# Patient Record
Sex: Male | Born: 1975
Health system: Southern US, Community
[De-identification: ages and names within clinical notes are randomized; demographics above are authoritative.]

## PROBLEM LIST (undated history)

## (undated) DIAGNOSIS — Z0000 Encounter for general adult medical examination without abnormal findings: Secondary | ICD-10-CM

## (undated) HISTORY — DX: Encounter for general adult medical examination without abnormal findings: Z00.00

---

## 2007-09-15 ENCOUNTER — Ambulatory Visit: Payer: Self-pay | Admitting: Emergency Medicine

## 2014-12-23 ENCOUNTER — Telehealth: Payer: Self-pay | Admitting: *Deleted

## 2014-12-24 NOTE — Telephone Encounter (Signed)
Yes I will take Jeremy Carpenter since Dr Gilda CreaseSchnier referred hi m

## 2014-12-24 NOTE — Telephone Encounter (Signed)
Thanks just as soon as you can .

## 2014-12-24 NOTE — Telephone Encounter (Signed)
Called to schedule pt, no answer, no voice mail enabled.  Will try back asap.

## 2014-12-24 NOTE — Telephone Encounter (Signed)
pt got a referral from Dr. Gilda CreaseSchnier to come and see Dr Darrick Huntsmanullo as a new pt. Offered new pt appt with Lyla Sonarrie. pt wife wanted him to see a MD Can you see if Dr. Darrick Huntsmanullo will see this pt as a new pt.  Please advise.

## 2014-12-24 NOTE — Telephone Encounter (Signed)
Please call and schedule patient for new patient appointment.

## 2015-01-15 ENCOUNTER — Ambulatory Visit (INDEPENDENT_AMBULATORY_CARE_PROVIDER_SITE_OTHER): Payer: 59 | Admitting: Internal Medicine

## 2015-01-15 ENCOUNTER — Encounter: Payer: Self-pay | Admitting: Internal Medicine

## 2015-01-15 VITALS — BP 118/78 | HR 64 | Temp 97.6°F | Resp 16 | Ht 70.0 in | Wt 222.5 lb

## 2015-01-15 DIAGNOSIS — Z23 Encounter for immunization: Secondary | ICD-10-CM

## 2015-01-15 DIAGNOSIS — Z0000 Encounter for general adult medical examination without abnormal findings: Secondary | ICD-10-CM | POA: Diagnosis not present

## 2015-01-15 DIAGNOSIS — Z0001 Encounter for general adult medical examination with abnormal findings: Secondary | ICD-10-CM | POA: Insufficient documentation

## 2015-01-15 DIAGNOSIS — E669 Obesity, unspecified: Secondary | ICD-10-CM

## 2015-01-15 DIAGNOSIS — R5383 Other fatigue: Secondary | ICD-10-CM

## 2015-01-15 DIAGNOSIS — Z13220 Encounter for screening for lipoid disorders: Secondary | ICD-10-CM | POA: Diagnosis not present

## 2015-01-15 DIAGNOSIS — Z1159 Encounter for screening for other viral diseases: Secondary | ICD-10-CM | POA: Diagnosis not present

## 2015-01-15 HISTORY — DX: Encounter for general adult medical examination without abnormal findings: Z00.00

## 2015-01-15 LAB — CBC WITH DIFFERENTIAL/PLATELET
BASOS ABS: 0.1 10*3/uL (ref 0.0–0.1)
BASOS PCT: 1 % (ref 0–1)
Eosinophils Absolute: 0.1 10*3/uL (ref 0.0–0.7)
Eosinophils Relative: 1 % (ref 0–5)
HCT: 45.1 % (ref 39.0–52.0)
Hemoglobin: 15.3 g/dL (ref 13.0–17.0)
Lymphocytes Relative: 27 % (ref 12–46)
Lymphs Abs: 1.6 10*3/uL (ref 0.7–4.0)
MCH: 29 pg (ref 26.0–34.0)
MCHC: 33.9 g/dL (ref 30.0–36.0)
MCV: 85.6 fL (ref 78.0–100.0)
MONO ABS: 0.7 10*3/uL (ref 0.1–1.0)
MPV: 10.3 fL (ref 8.6–12.4)
Monocytes Relative: 11 % (ref 3–12)
Neutro Abs: 3.6 10*3/uL (ref 1.7–7.7)
Neutrophils Relative %: 60 % (ref 43–77)
PLATELETS: 242 10*3/uL (ref 150–400)
RBC: 5.27 MIL/uL (ref 4.22–5.81)
RDW: 13 % (ref 11.5–15.5)
WBC: 6 10*3/uL (ref 4.0–10.5)

## 2015-01-15 LAB — COMPREHENSIVE METABOLIC PANEL
ALK PHOS: 58 U/L (ref 39–117)
ALT: 20 U/L (ref 0–53)
AST: 21 U/L (ref 0–37)
Albumin: 4.5 g/dL (ref 3.5–5.2)
BILIRUBIN TOTAL: 0.5 mg/dL (ref 0.2–1.2)
BUN: 16 mg/dL (ref 6–23)
CHLORIDE: 103 meq/L (ref 96–112)
CO2: 27 mEq/L (ref 19–32)
CREATININE: 1.41 mg/dL — AB (ref 0.50–1.35)
Calcium: 9.7 mg/dL (ref 8.4–10.5)
GLUCOSE: 74 mg/dL (ref 70–99)
POTASSIUM: 4.5 meq/L (ref 3.5–5.3)
Sodium: 140 mEq/L (ref 135–145)
Total Protein: 7.3 g/dL (ref 6.0–8.3)

## 2015-01-15 LAB — LIPID PANEL
CHOLESTEROL: 206 mg/dL — AB (ref 0–200)
HDL: 37 mg/dL — ABNORMAL LOW (ref >=40)
LDL CALC: 129 mg/dL — AB (ref 0–99)
TRIGLYCERIDES: 201 mg/dL — AB (ref <150)
Total CHOL/HDL Ratio: 5.6 Ratio
VLDL: 40 mg/dL (ref 0–40)

## 2015-01-15 NOTE — Progress Notes (Addendum)
Patient ID: Jeremy Carpenter, male   DOB: 11/29/1975, 39 y.o.   MRN: [id number]  The patient is here for his annual male physical examination and to establish care as a new .  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  Workup included an ENT evaluation and included CT /MRI of  Brain  vs sinuses , which was done at [facility]   PMH:  No hospitlaizations .  Had chicken pox as a child.  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    history of anosmia   unaccompaned by sinsu infeciton, , head injury 13 years ago ENT eval included CT /MRI of  Brain  vs sinuses  ,done at [facility]   PMH:  No hospitalizations .  Had chicken pox as a child and has had contact dermatitisi to [place]   Works i for [employer].  Making stamps, standing all day long,  Cement floor ,  Feet hurt .  ofr the past year,  Prior to that worked  For [employer],  Had one dog bite not reported,   s no complaints today.   The risk factors are reflected in the social history.  The roster of all physicians providing medical care to patient - is listed in the Snapshot section of the chart.  Activities of daily living:  The patient is 100% independent in all ADLs: dressing, toileting, feeding as well as independent mobility  Home safety : The patient has smoke detectors in the home. He wears seatbelts.  There are no firearms at home. There is no violence in the home.   There is no risks for hepatitis, STDs or HIV. There is no   history of blood transfusion. There is no travel history to infectious disease endemic areas of the world.  The patient has seen their  dentist in the last six month and  their eye doctor in the last year.  They do not  have excessive sun exposure. They have seen a dermatoloigist in the last year. Discussed the need for sun protection: hats, long sleeves and use of sunscreen if there is significant sun exposure.   Diet: the importance of a healthy diet is discussed. They do have a healthy diet.  The benefits of regular aerobic exercise were discussed. He exercises a minimum of 30 minutes  5 days per week. Depression screen: there are no signs or vegative symptoms of depression- irritability, change in appetite, anhedonia, sadness/tearfullness.  The following portions of the patient's history were reviewed and updated as appropriate: allergies, current medications, past family history, past medical history,  past surgical history, past social history  and problem list.  Visual acuity was not assessed per patient preference since he has regular follow up with his ophthalmologist. Hearing and body mass index were assessed and reviewed.   During the course of the visit the patient was educated and counseled about appropriate screening and preventive services including :  nutrition counseling, colorectal cancer screening, and recommended  immunizations.     Review of Systems:  Patient denies headache, fevers, malaise, unintentional weight loss, skin rash, eye pain, sinus congestion and sinus pain, sore throat, dysphagia,  hemoptysis , cough, dyspnea, wheezing, chest pain, palpitations, orthopnea, edema, abdominal pain, nausea, melena, diarrhea, constipation, flank pain, dysuria, hematuria, urinary  Frequency, nocturia, numbness, tingling, seizures,  Focal weakness, Loss of consciousness,  Tremor, insomnia, depression, anxiety, and suicidal ideation.     Objective:  BP 118/78 mmHg  Pulse 64  Temp(Src) 97.6 F (36.4 C) (Oral)  Resp 16  Ht $R'5\' 10"'gM$  (1.778 m)  Wt 222 lb 8 oz (100.925 kg)  BMI 31.93 kg/m2  SpO2 97%   General  appearance: alert, cooperative and appears stated age Ears: normal TM's and external ear canals both ears Throat: lips, mucosa, and tongue normal; teeth and gums normal Neck: no adenopathy, no carotid bruit, supple, symmetrical, trachea midline and thyroid not enlarged, symmetric, no tenderness/mass/nodules Back: symmetric, no curvature. ROM normal. No CVA tenderness. Lungs: clear to auscultation bilaterally Heart: regular rate and rhythm, S1, S2 normal, no murmur, click, rub or gallop Abdomen: soft, non-tender; bowel sounds normal; no masses,  no organomegaly Pulses: 2+ and symmetric Skin: Skin color, texture, turgor normal. No rashes or lesions Lymph nodes: Cervical, supraclavicular, and axillary nodes normal.  Assessment and Plan:   Problem List Items Addressed This Visit    Visit for preventive health examination    Annual wellness  exam was done as well as a comprehensive physical exam and management of acute and chronic conditions .  During the course of the visit the patient was educated and counseled about appropriate screening and preventive services including :  diabetes screening, lipid analysis with projected  10 year  risk for CAD , nutrition counseling, colorectal cancer screening, and recommended immunizations.  Printed recommendations for health maintenance screenings was given.       Relevant Orders   CBC w/Diff (Completed)   Comp Met (CMET) (Completed)   TSH (Completed)   Lipid panel (Completed)    Other Visit Diagnoses    Need for hepatitis C screening test    -  Primary    Relevant Orders    Hepatitis C antibody (Completed)    CBC w/Diff (Completed)    Comp Met (CMET) (Completed)    TSH (Completed)    Lipid panel (Completed)    Screening for hyperlipidemia        Relevant Orders    CBC w/Diff (Completed)    Comp Met (CMET) (Completed)    TSH (Completed)    Lipid panel (Completed)    Obesity        Relevant Orders    CBC w/Diff (Completed)    Comp Met  (CMET) (Completed)    TSH (Completed)    Lipid panel (Completed)    Other fatigue        Relevant Orders    CBC w/Diff (Completed)    Comp Met (CMET) (Completed)    TSH (Completed)    Lipid panel (Completed)    Need for prophylactic vaccination with combined diphtheria-tetanus-pertussis (DTP) vaccine        Relevant Orders    Tdap vaccine greater than or equal to 7yo IM (Completed)

## 2015-01-15 NOTE — Progress Notes (Signed)
Pre-visit discussion using our clinic review tool. No additional management support is needed unless otherwise documented below in the visit note.  

## 2015-01-15 NOTE — Patient Instructions (Signed)

## 2015-01-16 LAB — TSH: TSH: 2.065 u[IU]/mL (ref 0.350–4.500)

## 2015-01-16 LAB — HEPATITIS C ANTIBODY: HCV Ab: NEGATIVE

## 2015-01-17 ENCOUNTER — Telehealth: Payer: Self-pay | Admitting: Internal Medicine

## 2015-01-17 DIAGNOSIS — N289 Disorder of kidney and ureter, unspecified: Secondary | ICD-10-CM

## 2015-01-17 NOTE — Assessment & Plan Note (Signed)

## 2015-01-17 NOTE — Telephone Encounter (Signed)
Your thyroid and liver function are normal.  Your kidney function is slightly abnormal and your cholesterol is borderline.  I suggest repeating the kidney function blood test in a week, and the cholesterol in 6 months   Please call the office to set up the lab appointments.     Regards,   Dr. Darrick Huntsmanullo

## 2016-09-06 DIAGNOSIS — H5212 Myopia, left eye: Secondary | ICD-10-CM | POA: Diagnosis not present

## 2016-09-06 DIAGNOSIS — H52221 Regular astigmatism, right eye: Secondary | ICD-10-CM | POA: Diagnosis not present

## 2018-02-01 ENCOUNTER — Ambulatory Visit (INDEPENDENT_AMBULATORY_CARE_PROVIDER_SITE_OTHER): Payer: No Typology Code available for payment source | Admitting: Internal Medicine

## 2018-02-01 ENCOUNTER — Encounter: Payer: Self-pay | Admitting: Internal Medicine

## 2018-02-01 VITALS — BP 122/88 | HR 71 | Temp 98.6°F | Resp 15 | Ht 70.0 in | Wt 238.4 lb

## 2018-02-01 DIAGNOSIS — R030 Elevated blood-pressure reading, without diagnosis of hypertension: Secondary | ICD-10-CM | POA: Diagnosis not present

## 2018-02-01 DIAGNOSIS — E669 Obesity, unspecified: Secondary | ICD-10-CM

## 2018-02-01 DIAGNOSIS — Z0000 Encounter for general adult medical examination without abnormal findings: Secondary | ICD-10-CM | POA: Diagnosis not present

## 2018-02-01 DIAGNOSIS — Z13220 Encounter for screening for lipoid disorders: Secondary | ICD-10-CM | POA: Diagnosis not present

## 2018-02-01 DIAGNOSIS — R5383 Other fatigue: Secondary | ICD-10-CM | POA: Diagnosis not present

## 2018-02-01 NOTE — Progress Notes (Signed)
Patient ID: Jeremy Carpenter, male    DOB: [birth year]  Age: 42 y.o. MRN: [id number]  The patient is here for a a preventive  examination and management of other chronic and acute problems. Last seen Jan 15 2015 . Did not return for repeat assessment f abnormal renal fumction    The risk factors are reflected in the social history.  The roster of all physicians providing medical care to patient - is listed in the Snapshot section of the chart.  Activities of daily living:  The patient is 100% independent in all ADLs: dressing, toileting, feeding as well as independent mobility  Home safety : The patient has smoke detectors in the home. They wear seatbelts.  There are no firearms at home. There is no violence in the home.   There is no risks for hepatitis, STDs or HIV. There is no   history of blood transfusion. They have no travel history to infectious disease endemic areas of the world.  The patient has seen their dentist in the last six month. They havenot  seen their eye doctor in the last year.    They do not  have excessive sun exposure. Discussed the need for sun protection: hats, long sleeves and use of sunscreen if there is significant sun exposure.   Diet: the importance of a healthy diet is discussed. They do have a healthy diet.  The benefits of regular aerobic exercise were discussed. He does not exercise regularly    Depression screen: there are no signs or vegative symptoms of depression- irritability, change in appetite, anhedonia, sadness/tearfullness.   The following portions of the patient's history were reviewed and updated as appropriate: allergies, current medications, past family history, past medical history,  past surgical history, past social history  and problem list.  Visual acuity was not assessed per patient preference since he plans to have regular follow up with her ophthalmologist. Hearing and body mass index were assessed and reviewed.   During the course of the  visit the patient was educated and counseled about appropriate screening and preventive services including : , diabetes screening, obesity counseling, colorectal cancer screening, and recommended immunizations.    CC: The primary encounter diagnosis was Elevated blood-pressure reading without diagnosis of hypertension. Diagnoses of Screening for hyperlipidemia, Fatigue, unspecified type, Elevated blood pressure reading in office without diagnosis of hypertension, Obesity (BMI 30.0-34.9), and Visit for preventive health examination were also pertinent to this visit.  1) feeling sluggish all the time.  Sleeps poorly,  Snores.  Falling asleep before dinner   Sleep latency at night is 1  To 2 hours, denies anxiety.  Uses TV to fall asleep . Not exercising   Obesity :  Goal is 215 for BMI of 30.  Last time he saw 215 was 4 years ago ,  Same job,  More physical activity .    3) bilateral knee pain aggravated by squatting  .  Right elbow hurting for the past 6 months lateral side  repetitive lifting of 20 lb bin all day long . Plays softball in a community league ,  Pain is aggravated by throwing .  Has not tried icing it,  Has used Aleve and motrin .   History Jeremy Carpenter has a past medical history of Visit for preventive health examination (01/15/2015).   He has no past surgical history on file.   His family history includes Cancer in his maternal grandmother.He reports that he quit smoking about 18 years ago. His  smoking use included cigarettes. He started smoking about 22 years ago. He smoked 1.00 pack per day. He has never used smokeless tobacco. He reports that he drinks alcohol. He reports that he does not use drugs.  Outpatient Medications Prior to Visit  Medication Sig Dispense Refill  . Multiple Vitamin (MULTIVITAMIN) tablet Take 1 tablet by mouth daily.     No facility-administered medications prior to visit.     Review of Systems   .Patient denies headache, fevers, malaise, unintentional weight  loss, skin rash, eye pain, sinus congestion and sinus pain, sore throat, dysphagia,  hemoptysis , cough, dyspnea, wheezing, chest pain, palpitations, orthopnea, edema, abdominal pain, nausea, melena, diarrhea, constipation, flank pain, dysuria, hematuria, urinary  Frequency, nocturia, numbness, tingling, seizures,  Focal weakness, Loss of consciousness,  Tremor, insomnia, depression, anxiety, and suicidal ideation.      Objective:  BP 122/88 (BP Location: Left Arm, Patient Position: Sitting, Cuff Size: Normal)   Pulse 71   Temp 98.6 F (37 C) (Oral)   Resp 15   Ht  (1.778 m)   Wt 238 lb 6.4 oz (108.1 kg)   SpO2 96%   BMI 34.21 kg/m   Physical Exam   General appearance: alert, cooperative and appears stated age Ears: normal TM's and external ear canals both ears Throat: lips, mucosa, and tongue normal; teeth and gums normal Neck: no adenopathy, no carotid bruit, supple, symmetrical, trachea midline and thyroid not enlarged, symmetric, no tenderness/mass/nodules Back: symmetric, no curvature. ROM normal. No CVA tenderness. Lungs: clear to auscultation bilaterally Heart: regular rate and rhythm, S1, S2 normal, no murmur, click, rub or gallop Abdomen: soft, non-tender; bowel sounds normal; no masses,  no organomegaly Pulses: 2+ and symmetric Skin: Skin color, texture, turgor normal. No rashes or lesions Lymph nodes: Cervical, supraclavicular, and axillary nodes normal.    Assessment & Plan:   Problem List Items Addressed This Visit    Obesity (BMI 30.0-34.9)    I have addressed  BMI and recommended wt loss of 10% of body weigh over the next 6 months using a low glycemic index diet and regular exercise a minimum of 5 days per week.        Encounter for well adult exam with abnormal findings    Annual comprehensive preventive exam was done as well as an evaluation and management of chronic conditions .  During the course of the visit the patient was educated and counseled  about appropriate screening and preventive services including :  diabetes screening, lipid analysis with projected  10 year  risk for CAD , nutrition counseling, and colorectal cancer screening, and recommended immunizations.  Printed recommendations for health maintenance screenings was given.  Elevated blood pressure, obesity and mild hyperlipidemia were noted.   Lab Results  Component Value Date   CHOL 217 (H) 02/01/2018   HDL 45 02/01/2018   LDLCALC 141 (H) 02/01/2018   TRIG 174 (H) 02/01/2018   CHOLHDL 4.8 02/01/2018   Lab Results  Component Value Date   TSH 1.55 02/01/2018   No results found for: HGBA1C       Elevated blood pressure reading in office without diagnosis of hypertension    He has no prior history of hypertension. He will check his blood pressure several times over the next 3-4 weeks and to submit readings for evaluation. Urine microalbumin to creatinine ratio done today is normal.  Lab Results  Component Value Date   MICROALBUR 0.5 02/01/2018   Lab Results  Component Value  Date   CREATININE 1.16 02/01/2018   Lab Results  Component Value Date   NA 138 02/01/2018   K 4.3 02/01/2018   CL 102 02/01/2018   CO2 30 02/01/2018          Other Visit Diagnoses    Elevated blood-pressure reading without diagnosis of hypertension    -  Primary   Relevant Orders   Comprehensive metabolic panel (Completed)   Microalbumin / creatinine urine ratio (Completed)   Screening for hyperlipidemia       Relevant Orders   Lipid panel (Completed)   Fatigue, unspecified type       Relevant Orders   TSH (Completed)   CBC with Differential/Platelet (Completed)      I am having Jeremy Carpenter maintain his multivitamin.  No orders of the defined types were placed in this encounter.   There are no discontinued medications.  Follow-up: Return in about 1 year (around 02/02/2019).   Sherlene Shams, MD

## 2018-02-01 NOTE — Patient Instructions (Signed)
The new goals for optimal blood pressure management start at  120/70.  130/80 is now considered HIGH.  Please check your blood pressure a few times at home and send me the readings so I can determine if you need to start medication    I recommend starting a daily exercise program to help you lose weight  Your goal is 215 lbs to get your BMI < 30   Health Maintenance, Male A healthy lifestyle and preventive care is important for your health and wellness. Ask your health care provider about what schedule of regular examinations is right for you. What should I know about weight and diet? Eat a Healthy Diet  Eat plenty of vegetables, fruits, whole grains, low-fat dairy products, and lean protein.  Do not eat a lot of foods high in solid fats, added sugars, or salt.  Maintain a Healthy Weight Regular exercise can help you achieve or maintain a healthy weight. You should:  Do at least 150 minutes of exercise each week. The exercise should increase your heart rate and make you sweat (moderate-intensity exercise).  Do strength-training exercises at least twice a week.  Watch Your Levels of Cholesterol and Blood Lipids  Have your blood tested for lipids and cholesterol every 5 years starting at 42 years of age. If you are at high risk for heart disease, you should start having your blood tested when you are 42 years old. You may need to have your cholesterol levels checked more often if: ? Your lipid or cholesterol levels are high. ? You are older than 42 years of age. ? You are at high risk for heart disease.  What should I know about cancer screening? Many types of cancers can be detected early and may often be prevented. Lung Cancer  You should be screened every year for lung cancer if: ? You are a current smoker who has smoked for at least 30 years. ? You are a former smoker who has quit within the past 15 years.  Talk to your health care provider about your screening options, when  you should start screening, and how often you should be screened.  Colorectal Cancer  Routine colorectal cancer screening usually begins at 42 years of age and should be repeated every 5-10 years until you are 42 years old. You may need to be screened more often if early forms of precancerous polyps or small growths are found. Your health care provider may recommend screening at an earlier age if you have risk factors for colon cancer.  Your health care provider may recommend using home test kits to check for hidden blood in the stool.  A small camera at the end of a tube can be used to examine your colon (sigmoidoscopy or colonoscopy). This checks for the earliest forms of colorectal cancer.  Prostate and Testicular Cancer  Depending on your age and overall health, your health care provider may do certain tests to screen for prostate and testicular cancer.  Talk to your health care provider about any symptoms or concerns you have about testicular or prostate cancer.  Skin Cancer  Check your skin from head to toe regularly.  Tell your health care provider about any new moles or changes in moles, especially if: ? There is a change in a mole's size, shape, or color. ? You have a mole that is larger than a pencil eraser.  Always use sunscreen. Apply sunscreen liberally and repeat throughout the day.  Protect yourself by wearing long  sleeves, pants, a wide-brimmed hat, and sunglasses when outside.  What should I know about heart disease, diabetes, and high blood pressure?  If you are 55-31 years of age, have your blood pressure checked every 3-5 years. If you are 59 years of age or older, have your blood pressure checked every year. You should have your blood pressure measured twice-once when you are at a hospital or clinic, and once when you are not at a hospital or clinic. Record the average of the two measurements. To check your blood pressure when you are not at a hospital or clinic,  you can use: ? An automated blood pressure machine at a pharmacy. ? A home blood pressure monitor.  Talk to your health care provider about your target blood pressure.  If you are between 35-32 years old, ask your health care provider if you should take aspirin to prevent heart disease.  Have regular diabetes screenings by checking your fasting blood sugar level. ? If you are at a normal weight and have a low risk for diabetes, have this test once every three years after the age of 1. ? If you are overweight and have a high risk for diabetes, consider being tested at a younger age or more often.  A one-time screening for abdominal aortic aneurysm (AAA) by ultrasound is recommended for men aged 65-75 years who are current or former smokers. What should I know about preventing infection? Hepatitis B If you have a higher risk for hepatitis B, you should be screened for this virus. Talk with your health care provider to find out if you are at risk for hepatitis B infection. Hepatitis C Blood testing is recommended for:  Everyone born from [birth year] through 1965.  Anyone with known risk factors for hepatitis C.  Sexually Transmitted Diseases (STDs)  You should be screened each year for STDs including gonorrhea and chlamydia if: ? You are sexually active and are younger than 42 years of age. ? You are older than 42 years of age and your health care provider tells you that you are at risk for this type of infection. ? Your sexual activity has changed since you were last screened and you are at an increased risk for chlamydia or gonorrhea. Ask your health care provider if you are at risk.  Talk with your health care provider about whether you are at high risk of being infected with HIV. Your health care provider may recommend a prescription medicine to help prevent HIV infection.  What else can I do?  Schedule regular health, dental, and eye exams.  Stay current with your vaccines  (immunizations).  Do not use any tobacco products, such as cigarettes, chewing tobacco, and e-cigarettes. If you need help quitting, ask your health care provider.  Limit alcohol intake to no more than 2 drinks per day. One drink equals 12 ounces of beer, 5 ounces of wine, or 1 ounces of hard liquor.  Do not use street drugs.  Do not share needles.  Ask your health care provider for help if you need support or information about quitting drugs.  Tell your health care provider if you often feel depressed.  Tell your health care provider if you have ever been abused or do not feel safe at home. This information is not intended to replace advice given to you by your health care provider. Make sure you discuss any questions you have with your health care provider. Document Released: 02/24/2008 Document Revised: 04/26/2016 Document Reviewed: 06/01/2015  Elsevier Interactive Patient Education  2018 [employer]  

## 2018-02-02 LAB — MICROALBUMIN / CREATININE URINE RATIO
Creatinine, Urine: 251 mg/dL (ref 20–320)
MICROALB UR: 0.5 mg/dL
MICROALB/CREAT RATIO: 2 ug/mg{creat} (ref <30)

## 2018-02-02 LAB — COMPREHENSIVE METABOLIC PANEL WITH GFR
AG Ratio: 1.8 (calc) (ref 1.0–2.5)
ALT: 31 U/L (ref 9–46)
AST: 24 U/L (ref 10–40)
Albumin: 4.5 g/dL (ref 3.6–5.1)
Alkaline phosphatase (APISO): 87 U/L (ref 40–115)
BUN: 14 mg/dL (ref 7–25)
CO2: 30 mmol/L (ref 20–32)
Calcium: 9.8 mg/dL (ref 8.6–10.3)
Chloride: 102 mmol/L (ref 98–110)
Creat: 1.16 mg/dL (ref 0.60–1.35)
Globulin: 2.5 g/dL (ref 1.9–3.7)
Glucose, Bld: 93 mg/dL (ref 65–99)
Potassium: 4.3 mmol/L (ref 3.5–5.3)
Sodium: 138 mmol/L (ref 135–146)
Total Bilirubin: 0.6 mg/dL (ref 0.2–1.2)
Total Protein: 7 g/dL (ref 6.1–8.1)

## 2018-02-02 LAB — TSH: TSH: 1.55 m[IU]/L (ref 0.40–4.50)

## 2018-02-02 LAB — LIPID PANEL
Cholesterol: 217 mg/dL — ABNORMAL HIGH
HDL: 45 mg/dL
LDL Cholesterol (Calc): 141 mg/dL — ABNORMAL HIGH
Non-HDL Cholesterol (Calc): 172 mg/dL — ABNORMAL HIGH
Total CHOL/HDL Ratio: 4.8 (calc)
Triglycerides: 174 mg/dL — ABNORMAL HIGH

## 2018-02-02 LAB — CBC WITH DIFFERENTIAL/PLATELET
Basophils Absolute: 40 cells/uL (ref 0–200)
Basophils Relative: 0.6 %
EOS PCT: 0.6 %
Eosinophils Absolute: 40 cells/uL (ref 15–500)
HEMATOCRIT: 42.6 % (ref 38.5–50.0)
HEMOGLOBIN: 14.5 g/dL (ref 13.2–17.1)
LYMPHS ABS: 2037 {cells}/uL (ref 850–3900)
MCH: 28.8 pg (ref 27.0–33.0)
MCHC: 34 g/dL (ref 32.0–36.0)
MCV: 84.7 fL (ref 80.0–100.0)
MPV: 10.5 fL (ref 7.5–12.5)
Monocytes Relative: 11.7 %
NEUTROS ABS: 3799 {cells}/uL (ref 1500–7800)
NEUTROS PCT: 56.7 %
Platelets: 256 10*3/uL (ref 140–400)
RBC: 5.03 10*6/uL (ref 4.20–5.80)
RDW: 12 % (ref 11.0–15.0)
Total Lymphocyte: 30.4 %
WBC: 6.7 10*3/uL (ref 3.8–10.8)
WBCMIX: 784 {cells}/uL (ref 200–950)

## 2018-02-02 LAB — EXTRA URINE SPECIMEN

## 2018-02-03 ENCOUNTER — Encounter: Payer: Self-pay | Admitting: Internal Medicine

## 2018-02-03 DIAGNOSIS — E669 Obesity, unspecified: Secondary | ICD-10-CM | POA: Insufficient documentation

## 2018-02-03 DIAGNOSIS — R030 Elevated blood-pressure reading, without diagnosis of hypertension: Secondary | ICD-10-CM | POA: Insufficient documentation

## 2018-02-03 NOTE — Assessment & Plan Note (Addendum)
He has no prior history of hypertension. He will check his blood pressure several times over the next 3-4 weeks and to submit readings for evaluation. Urine microalbumin to creatinine ratio done today is normal.  Lab Results  Component Value Date   MICROALBUR 0.5 02/01/2018   Lab Results  Component Value Date   CREATININE 1.16 02/01/2018   Lab Results  Component Value Date   NA 138 02/01/2018   K 4.3 02/01/2018   CL 102 02/01/2018   CO2 30 02/01/2018

## 2018-02-03 NOTE — Assessment & Plan Note (Addendum)
Annual comprehensive preventive exam was done as well as an evaluation and management of ELEVATED BLOOD PRESSURE AND OBESITY  .  During the course of the visit the patient was educated and counseled about appropriate screening and preventive services including :  diabetes screening, lipid analysis with projected  10 year  risk for CAD (5%)  , nutrition counseling, and colorectal cancer screening, and recommended immunizations.  Printed recommendations for health maintenance screenings was given.    Lab Results  Component Value Date   CHOL 217 (H) 02/01/2018   HDL 45 02/01/2018   LDLCALC 141 (H) 02/01/2018   TRIG 174 (H) 02/01/2018   CHOLHDL 4.8 02/01/2018   Lab Results  Component Value Date   TSH 1.55 02/01/2018   No results found for: HGBA1C

## 2018-02-03 NOTE — Assessment & Plan Note (Signed)
I have addressed  BMI and recommended wt loss of 10% of body weigh over the next 6 months using a low glycemic index diet and regular exercise a minimum of 5 days per week.   

## 2018-07-01 ENCOUNTER — Ambulatory Visit (INDEPENDENT_AMBULATORY_CARE_PROVIDER_SITE_OTHER): Payer: Self-pay | Admitting: Physician Assistant

## 2018-07-01 DIAGNOSIS — Z23 Encounter for immunization: Secondary | ICD-10-CM

## 2018-07-01 NOTE — Progress Notes (Signed)
Pt presents here today for visit to receive Influenza vaccine. Allergies reviewed, vaccine given IM left deltoid, vaccine information statement provided, tolerated well.  Lola Lofaro, RMA Registered Medical Assistant [facility] [phone] 

## 2018-07-01 NOTE — Patient Instructions (Signed)

## 2019-06-10 ENCOUNTER — Ambulatory Visit (INDEPENDENT_AMBULATORY_CARE_PROVIDER_SITE_OTHER): Payer: No Typology Code available for payment source | Admitting: Internal Medicine

## 2019-06-10 ENCOUNTER — Other Ambulatory Visit: Payer: Self-pay

## 2019-06-10 ENCOUNTER — Encounter: Payer: Self-pay | Admitting: Internal Medicine

## 2019-06-10 VITALS — BP 114/78 | HR 84 | Temp 97.7°F | Resp 14 | Ht 70.0 in | Wt 241.0 lb

## 2019-06-10 DIAGNOSIS — L247 Irritant contact dermatitis due to plants, except food: Secondary | ICD-10-CM | POA: Diagnosis not present

## 2019-06-10 DIAGNOSIS — Z0000 Encounter for general adult medical examination without abnormal findings: Secondary | ICD-10-CM

## 2019-06-10 DIAGNOSIS — Z23 Encounter for immunization: Secondary | ICD-10-CM

## 2019-06-10 DIAGNOSIS — E669 Obesity, unspecified: Secondary | ICD-10-CM

## 2019-06-10 MED ORDER — TRIAMCINOLONE ACETONIDE 0.1 % EX CREA: 1.0000 "application " | TOPICAL_CREAM | Freq: Two times a day (BID) | CUTANEOUS | 0 refills | Status: DC

## 2019-06-10 NOTE — Patient Instructions (Signed)

## 2019-06-10 NOTE — Assessment & Plan Note (Signed)
I have addressed  BMI and recommended wt loss of 10% of body weigh over the next 6 months using a low glycemic index diet and regular exercise a minimum of 5 days per week.   

## 2019-06-10 NOTE — Assessment & Plan Note (Signed)

## 2019-06-10 NOTE — Progress Notes (Signed)
Patient ID: Jeremy Carpenter, male    DOB: 1976/05/02  Age: 43 y.o. MRN: [id number]  The patient is here for annual preventive  examination and management of other chronic and acute problems.   The risk factors are reflected in the social history.  The roster of all physicians providing medical care to patient - is listed in the Snapshot section of the chart.  Activities of daily living:  The patient is 100% independent in all ADLs: dressing, toileting, feeding as well as independent mobility  Home safety : The patient has smoke detectors in the home. They wear seatbelts.  There are no firearms at home. There is no violence in the home.   There is no risks for hepatitis, STDs or HIV. There is no   history of blood transfusion. They have no travel history to infectious disease endemic areas of the world.  The patient has seen their dentist in the last six month. They have seen their eye doctor in the last year.  They do not  have excessive sun exposure. Discussed the need for sun protection: hats, long sleeves and use of sunscreen if there is significant sun exposure.   Diet: the importance of a healthy diet is discussed. They do have a healthy diet.  The benefits of regular aerobic exercise were discussed. He does not exercise regularly   Depression screen: there are no signs or vegative symptoms of depression- irritability, change in appetite, anhedonia, sadness/tearfullness.  Cognitive assessment: the patient manages all their financial and personal affairs and is actively engaged. They could relate day,date,year and events; recalled 2/3 objects at 3 minutes; performed clock-face test normally.  The following portions of the patient's history were reviewed and updated as appropriate: allergies, current medications, past family history, past medical history,  past surgical history, past social history  and problem list.  Visual acuity was not assessed per patient preference since she has regular  follow up with her ophthalmologist. Hearing and body mass index were assessed and reviewed.   During the course of the visit the patient was educated and counseled about appropriate screening and preventive services including : fall prevention , diabetes screening, nutrition counseling, colorectal cancer screening, and recommended immunizations.    CC: Diagnoses of Need for immunization against influenza, Obesity (BMI 30.0-34.9), Encounter for preventive health examination, and Contact dermatitis and eczema due to plant were pertinent to this visit.  The patient has no signs or symptoms of COVID 19 infection (fever, cough, sore throat  or shortness of breath beyond what is typical for patient).  Patient denies contact with other persons with the above mentioned symptoms or with anyone confirmed to have [place] on hand.  First vesicles appeared on Saturday   Occasional orthostasis after crouching  For extended periods of time.    History Jeremy Carpenter has a past medical history of Visit for preventive health examination (01/15/2015).   He has no past surgical history on file.   His family history includes Cancer in his maternal grandmother.He reports that he quit smoking about 19 years ago. His smoking use included cigarettes. He started smoking about 23 years ago. He smoked 1.00 pack per day. He has never used smokeless tobacco. He reports current alcohol use. He reports that he does not use drugs.  Outpatient Medications Prior to Visit  Medication Sig Dispense Refill  . Multiple Vitamin (MULTIVITAMIN) tablet Take 1 tablet by mouth daily.     No facility-administered medications prior to visit.  Review of Systems   Patient denies headache, fevers, malaise, unintentional weight loss, skin rash, eye pain, sinus congestion and sinus pain, sore throat, dysphagia,  hemoptysis , cough, dyspnea, wheezing, chest pain, palpitations, orthopnea, edema, abdominal pain, nausea, melena,  diarrhea, constipation, flank pain, dysuria, hematuria, urinary  Frequency, nocturia, numbness, tingling, seizures,  Focal weakness, Loss of consciousness,  Tremor, insomnia, depression, anxiety, and suicidal ideation.     Objective:  BP 114/78 (BP Location: Left Arm, Patient Position: Sitting, Cuff Size: Large)   Pulse 84   Temp 97.7 F (36.5 C) (Temporal)   Resp 14   Ht 5\' 10"  (1.778 m)   Wt 241 lb (109.3 kg)   SpO2 96%   BMI 34.58 kg/m   Physical Exam  .General appearance: alert, cooperative and appears stated age Ears: normal TM's and external ear canals both ears Throat: lips, mucosa, and tongue normal; teeth and gums normal Neck: no adenopathy, no carotid bruit, supple, symmetrical, trachea midline and thyroid not enlarged, symmetric, no tenderness/mass/nodules Back: symmetric, no curvature. ROM normal. No CVA tenderness. Lungs: clear to auscultation bilaterally Heart: regular rate and rhythm, S1, S2 normal, no murmur, click, rub or gallop Abdomen: soft, non-tender; bowel sounds normal; no masses,  no organomegaly Pulses: 2+ and symmetric Skin: vesicular rash on dorsal surface of right hand and left flank. Lymph nodes: Cervical, supraclavicular, and axillary nodes normal.   Assessment & Plan:   Problem List Items Addressed This Visit      Unprioritized   Encounter for preventive health examination    age appropriate education and counseling updated, referrals for preventative services and immunizations addressed, dietary and smoking counseling addressed, most recent labs reviewed.  I have personally reviewed and have noted:  1) the patient's medical and social history 2) The pt's use of alcohol, tobacco, and illicit drugs 3) The patient's current medications and supplements 4) Functional ability including ADL's, fall risk, home safety risk, hearing and visual impairment 5) Diet and physical activities 6) Evidence for depression or mood disorder 7) The patient's  height, weight, and BMI have been recorded in the chart  I have made referrals, and provided counseling and education based on review of the above      Obesity (BMI 30.0-34.9)    I have addressed  BMI and recommended wt loss of 10% of body weigh over the next 6 months using a low glycemic index diet and regular exercise a minimum of 5 days per week.        Contact dermatitis and eczema due to plant    Triamcinolone ointment prescribed for contact dermatitis secondary to poison sumac.       Other Visit Diagnoses    Need for immunization against influenza       Relevant Orders   Flu Vaccine QUAD 36+ mos IM (Completed)      I have discontinued Easter A. Korber's multivitamin. I am also having him start on triamcinolone cream.  Meds ordered this encounter  Medications  . triamcinolone cream (KENALOG) 0.1 %    Sig: Apply 1 application topically 2 (two) times daily.    Dispense:  30 g    Refill:  0    Medications Discontinued During This Encounter  Medication Reason  . Multiple Vitamin (MULTIVITAMIN) tablet Patient has not taken in last 30 days    Follow-up: No follow-ups on file.   01-13-1993, MD

## 2019-06-10 NOTE — Assessment & Plan Note (Signed)
Triamcinolone ointment prescribed for contact dermatitis secondary to poison sumac.

## 2019-09-09 ENCOUNTER — Ambulatory Visit: Payer: No Typology Code available for payment source | Attending: Internal Medicine

## 2019-09-09 DIAGNOSIS — Z20822 Contact with and (suspected) exposure to covid-19: Secondary | ICD-10-CM

## 2019-09-10 LAB — NOVEL CORONAVIRUS, NAA: SARS-CoV-2, NAA: NOT DETECTED

## 2019-10-09 ENCOUNTER — Ambulatory Visit: Payer: No Typology Code available for payment source | Attending: Internal Medicine

## 2019-10-09 DIAGNOSIS — Z20822 Contact with and (suspected) exposure to covid-19: Secondary | ICD-10-CM

## 2019-10-10 LAB — NOVEL CORONAVIRUS, NAA: SARS-CoV-2, NAA: NOT DETECTED

## 2020-02-03 ENCOUNTER — Ambulatory Visit: Payer: No Typology Code available for payment source | Attending: Internal Medicine

## 2020-02-03 DIAGNOSIS — Z23 Encounter for immunization: Secondary | ICD-10-CM

## 2020-02-03 NOTE — Progress Notes (Signed)
   Covid-19 Vaccination Clinic  Name:  Jeremy Carpenter    MRN: [id number] DOB: 04-Jun-1976  02/03/2020  [name] was observed post Covid-19 immunization for 15 minutes without incident. He was provided with Vaccine Information Sheet and instruction to access the V-Safe system.   [name] was instructed to call 911 with any severe reactions post vaccine: Marland Kitchen Difficulty breathing  . Swelling of face and throat  . A fast heartbeat  . A bad rash all over body  . Dizziness and weakness   Immunizations Administered    Name Date Dose VIS Date Route   Pfizer COVID-19 Vaccine 02/03/2020  3:19 PM 0.3 mL 11/05/2018 Intramuscular   Manufacturer: [employer], [employer]   Lot: [id number]   NDC: [address]-1

## 2020-02-24 ENCOUNTER — Ambulatory Visit: Payer: No Typology Code available for payment source | Attending: Internal Medicine

## 2020-02-24 DIAGNOSIS — Z23 Encounter for immunization: Secondary | ICD-10-CM

## 2020-02-24 NOTE — Progress Notes (Signed)
   Covid-19 Vaccination Clinic  Name:  PONCIANO SHEALY    MRN: [id number] DOB: 29-Nov-1975  02/24/2020  Mr. Schipani was observed post Covid-19 immunization for 15 minutes without incident. He was provided with Vaccine Information Sheet and instruction to access the V-Safe system.   Mr. Peets was instructed to call 911 with any severe reactions post vaccine: Marland Kitchen Difficulty breathing  . Swelling of face and throat  . A fast heartbeat  . A bad rash all over body  . Dizziness and weakness   Immunizations Administered    Name Date Dose VIS Date Route   Pfizer COVID-19 Vaccine 02/24/2020  3:59 PM 0.3 mL 11/05/2018 Intramuscular   Manufacturer: [employer], [employer]   Lot: [id number]   NDC: [address]-3

## 2020-11-09 ENCOUNTER — Other Ambulatory Visit (HOSPITAL_COMMUNITY): Payer: Self-pay | Admitting: Physician Assistant

## 2020-11-09 ENCOUNTER — Telehealth: Payer: No Typology Code available for payment source | Admitting: Physician Assistant

## 2020-11-09 DIAGNOSIS — J069 Acute upper respiratory infection, unspecified: Secondary | ICD-10-CM

## 2020-11-09 MED ORDER — FLUTICASONE PROPIONATE 50 MCG/ACT NA SUSP
2.0000 | Freq: Every day | NASAL | 0 refills | Status: DC
Start: 2020-11-09 — End: 2021-02-23

## 2020-11-09 MED ORDER — BENZONATATE 100 MG PO CAPS: 100.0000 mg | ORAL_CAPSULE | Freq: Three times a day (TID) | ORAL | 0 refills | Status: DC

## 2020-11-09 NOTE — Progress Notes (Signed)
We are sorry you are not feeling well.  Here is how we plan to help!  Based on what you have shared with me, it looks like you may have a viral upper respiratory infection.    Upper respiratory infections are caused by a large number of viruses and In light of the recent COVID pandemic, it is possible your symptoms could also be related to the COVID-19 virus and  I recommend you be tested for this.  Symptoms vary from person to person, with common symptoms including sore throat, cough, fatigue or lack of energy and feeling of general discomfort.  A low-grade fever of up to 100.4 may present, but is often uncommon.  Symptoms vary however, and are closely related to a person's age or underlying illnesses.  The most common symptoms associated with an upper respiratory infection are nasal discharge or congestion, cough, sneezing, headache and pressure in the ears and face.  These symptoms usually persist for about 3 to 10 days, but can last up to 2 weeks.  It is important to know that upper respiratory infections do not cause serious illness or complications in most cases.    Upper respiratory infections can be transmitted from person to person, with the most common method of transmission being a person's hands.  The virus is able to live on the skin and can infect other persons for up to 2 hours after direct contact.  Also, these can be transmitted when someone coughs or sneezes; thus, it is important to cover the mouth to reduce this risk.  To keep the spread of the illness at bay, good hand hygiene is very important.  This is an infection that is most likely caused by a virus. There are no specific treatments other than to help you with the symptoms until the infection runs its course.  We are sorry you are not feeling well.  Here is how we plan to help!   For nasal congestion, you may use an oral decongestants such as Mucinex D or if you have glaucoma or high blood pressure use plain Mucinex.  Saline  nasal spray or nasal drops can help and can safely be used as often as needed for congestion.  For your congestion, I have prescribed Fluticasone nasal spray one spray in each nostril twice a day  If you do not have a history of heart disease, hypertension, diabetes or thyroid disease, prostate/bladder issues or glaucoma, you may also use Sudafed to treat nasal congestion.  It is highly recommended that you consult with a pharmacist or your primary care physician to ensure this medication is safe for you to take.     If you have a cough, you may use cough suppressants such as Delsym and Robitussin.  If you have glaucoma or high blood pressure, you can also use Coricidin HBP.   For cough I have prescribed for you A prescription cough medication called Tessalon Perles 100 mg. You may take 1-2 capsules every 8 hours as needed for cough  If you have a sore or scratchy throat, use a saltwater gargle-  to  teaspoon of salt dissolved in a 4-ounce to 8-ounce glass of warm water.  Gargle the solution for approximately 15-30 seconds and then spit.  It is important not to swallow the solution.  You can also use throat lozenges/cough drops and Chloraseptic spray to help with throat pain or discomfort.  Warm or cold liquids can also be helpful in relieving throat pain.  For headache,  pain or general discomfort, you can use Ibuprofen or Tylenol as directed.   Some authorities believe that zinc sprays or the use of Echinacea may shorten the course of your symptoms.   HOME CARE . Only take medications as instructed by your medical team. . Be sure to drink plenty of fluids. Water is fine as well as fruit juices, sodas and electrolyte beverages. You may want to stay away from caffeine or alcohol. If you are nauseated, try taking small sips of liquids. How do you know if you are getting enough fluid? Your urine should be a pale yellow or almost colorless. . Get rest. . Taking a steamy shower or using a humidifier  may help nasal congestion and ease sore throat pain. You can place a towel over your head and breathe in the steam from hot water coming from a faucet. . Using a saline nasal spray works much the same way. . Cough drops, hard candies and sore throat lozenges may ease your cough. . Avoid close contacts especially the very young and the elderly . Cover your mouth if you cough or sneeze . Always remember to wash your hands.   GET HELP RIGHT AWAY IF: . You develop worsening fever. . If your symptoms do not improve within 10 days . You develop yellow or green discharge from your nose over 3 days. . You have coughing fits . You develop a severe head ache or visual changes. . You develop shortness of breath, difficulty breathing or start having chest pain . Your symptoms persist after you have completed your treatment plan  MAKE SURE YOU   Understand these instructions.  Will watch your condition.  Will get help right away if you are not doing well or get worse.  Your e-visit answers were reviewed by a board certified advanced clinical practitioner to complete your personal care plan. Depending upon the condition, your plan could have included both over the counter or prescription medications. Please review your pharmacy choice. If there is a problem, you may call our nursing hot line at and have the prescription routed to another pharmacy. Your safety is important to [place]. If you have drug allergies check your prescription carefully.   You can use MyChart to ask questions about today's visit, request a non-urgent call back, or ask for a work or school excuse for 24 hours related to this e-Visit. If it has been greater than 24 hours you will need to follow up with your provider, or enter a new e-Visit to address those concerns. You will get an e-mail in the next two days asking about your experience.  I hope that your e-visit has been valuable and will speed your recovery. Thank you for using  e-visits.   Approximately 5 minutes was spent documenting and reviewing patient's chart.

## 2020-11-10 ENCOUNTER — Telehealth (INDEPENDENT_AMBULATORY_CARE_PROVIDER_SITE_OTHER): Payer: No Typology Code available for payment source | Admitting: Family

## 2020-11-10 ENCOUNTER — Encounter: Payer: Self-pay | Admitting: Family

## 2020-11-10 DIAGNOSIS — Z8616 Personal history of COVID-19: Secondary | ICD-10-CM | POA: Insufficient documentation

## 2020-11-10 DIAGNOSIS — U071 COVID-19: Secondary | ICD-10-CM

## 2020-11-10 NOTE — Progress Notes (Signed)
Virtual Visit via Video Note  I connected with@  on 11/10/20 at 11:00 AM EST by a video enabled telemedicine application and verified that I am speaking with the correct person using two identifiers.  Location patient: home Location provider:work  Persons participating in the virtual visit: patient, provider  I discussed the limitations of evaluation and management by telemedicine and the availability of in person appointments. The patient expressed understanding and agreed to proceed.   HPI: Acute visit covid positive yesterday  Symptoms started 4 days, with worsening of sore throat. Sinus pressure is unchanged. Low grade fever 99.5 F.   Endorses sore throat, deep dry cough, nasal congestion. No sob, cp, facial swelling.   He had e visit yesterday. Taking tylenol with some relief. He hasnt taken tessalon perles or flonase which was prescribed yesterday.  Drinking of plenty water  Former smoker.   No known h/o lung disease.      ROS: See pertinent positives and negatives per HPI.    EXAM:  VITALS per patient if applicable: Ht 5\' 10"  (1.778 m)   Wt 235 lb (106.6 kg)   BMI 33.72 kg/m  BP Readings from Last 3 Encounters:  06/10/19 114/78  02/01/18 122/88  01/15/15 118/78   Wt Readings from Last 3 Encounters:  11/10/20 235 lb (106.6 kg)  06/10/19 241 lb (109.3 kg)  02/01/18 238 lb 6.4 oz (108.1 kg)    GENERAL: alert, oriented, appears well and in no acute distress  HEENT: atraumatic, conjunttiva clear, no obvious abnormalities on inspection of external nose and ears  NECK: normal movements of the head and neck  LUNGS: on inspection no signs of respiratory distress, breathing rate appears normal, no obvious gross SOB, gasping or wheezing  CV: no obvious cyanosis  MS: moves all visible extremities without noticeable abnormality  PSYCH/NEURO: pleasant and cooperative, no obvious depression or anxiety, speech and thought processing grossly intact  ASSESSMENT  AND PLAN:  Discussed the following assessment and plan:  Problem List Items Addressed This Visit      Other   COVID-19    No acute respiratory distress. Patient is well appearing over video. We discussed continue conservative management with tessalon perles, mucinex dm. Counseled on quarantine and symptoms such as SOB, dizziness, worsening symptom which warrant further evaluation in person.           -we discussed possible serious and likely etiologies, options for evaluation and workup, limitations of telemedicine visit vs in person visit, treatment, treatment risks and precautions. Pt prefers to treat via telemedicine empirically rather then risking or undertaking an in person visit at this moment.  .   I discussed the assessment and treatment plan with the patient. The patient was provided an opportunity to ask questions and all were answered. The patient agreed with the plan and demonstrated an understanding of the instructions.   The patient was advised to call back or seek an in-person evaluation if the symptoms worsen or if the condition fails to improve as anticipated.   02/03/18, FNP

## 2020-11-10 NOTE — Telephone Encounter (Signed)
Scheduled pt for a virtual visit with Rennie Plowman, NP today. Pt is aware of appt.

## 2020-11-10 NOTE — Assessment & Plan Note (Signed)
No acute respiratory distress. Patient is well appearing over video. We discussed continue conservative management with tessalon perles, mucinex dm. Counseled on quarantine and symptoms such as SOB, dizziness, worsening symptom which warrant further evaluation in person.

## 2020-11-10 NOTE — Patient Instructions (Addendum)
Quarantine for 10 days from symptom onset.     Purchase pulse oximeter to monitor oxygenation saturation. If you oxygen were to drop < 90%, this is an emergency and you may need oxygen support. You would need to seek in person evaluation at the emergency room or call 911.   Utilize over the counter medications to treat symptoms.  Please start Mucinex DM to take at bedtime for cough.     Please begin Vit C 1000 mg daily   Vitamin D3 4000 IU daily   Zinc 50 mg daily   Quercetin 250 mg -500 mg twice daily ; this an antioxidant which reduces inflammation. You can find it at places such as [employer] and [employer] however not limited to these stores.   Once you are better, you may STOP all the above supplements.   Rest, hydrate very well, eat healthy protein food, Tylenol or Advil as directed .    Please go to Acute Care if symptoms worsen but are not an emergency.      Seek  treatment in the ED if respiratory issues/distress develops or unrelieved chest pain. Or, if you become severely weak, dehydrated, confused.

## 2021-02-23 ENCOUNTER — Other Ambulatory Visit: Payer: Self-pay

## 2021-02-23 ENCOUNTER — Ambulatory Visit (INDEPENDENT_AMBULATORY_CARE_PROVIDER_SITE_OTHER): Payer: No Typology Code available for payment source | Admitting: Internal Medicine

## 2021-02-23 ENCOUNTER — Encounter: Payer: Self-pay | Admitting: Internal Medicine

## 2021-02-23 VITALS — BP 106/76 | HR 75 | Temp 96.9°F | Resp 14 | Ht 70.0 in | Wt 236.6 lb

## 2021-02-23 DIAGNOSIS — Z8616 Personal history of COVID-19: Secondary | ICD-10-CM | POA: Diagnosis not present

## 2021-02-23 DIAGNOSIS — M7751 Other enthesopathy of right foot: Secondary | ICD-10-CM

## 2021-02-23 DIAGNOSIS — Z0000 Encounter for general adult medical examination without abnormal findings: Secondary | ICD-10-CM

## 2021-02-23 DIAGNOSIS — F5104 Psychophysiologic insomnia: Secondary | ICD-10-CM

## 2021-02-23 DIAGNOSIS — E669 Obesity, unspecified: Secondary | ICD-10-CM

## 2021-02-23 MED ORDER — MELOXICAM 15 MG PO TABS
15.0000 mg | ORAL_TABLET | Freq: Every day | ORAL | 2 refills | Status: DC
  Filled 2021-02-23: qty 30, 30d supply, fill #0

## 2021-02-23 MED ORDER — OMEPRAZOLE 40 MG PO CPDR
40.0000 mg | DELAYED_RELEASE_CAPSULE | Freq: Every day | ORAL | 3 refills | Status: DC
  Filled 2021-02-23: qty 30, 30d supply, fill #0

## 2021-02-23 NOTE — Progress Notes (Signed)
Patient ID: Jeremy Carpenter, male    DOB: [birth year]  Age: 45 y.o. MRN: [id number]  The patient is here for annual preventive examination and management of other chronic and acute problems.   The risk factors are reflected in the social history.  The roster of all physicians providing medical care to patient - is listed in the Snapshot section of the chart.  Activities of daily living:  The patient is 100% independent in all ADLs: dressing, toileting, feeding as well as independent mobility  Home safety : The patient has smoke detectors in the home. They wear seatbelts.  There are no firearms at home. There is no violence in the home.   There is no risks for hepatitis, STDs or HIV. There is no   history of blood transfusion. They have no travel history to infectious disease endemic areas of the world.  The patient has seen their dentist in the last six month. They have seen their eye doctor in the last year. He denies  hearing difficulty with regard to whispered voices and some television programs.  They have deferred audiologic testing in the last year.  They do not  have excessive sun exposure. Discussed the need for sun protection: hats, long sleeves and use of sunscreen if there is significant sun exposure.   Diet: the importance of a healthy diet is discussed. They do have a healthy diet.  The benefits of regular aerobic exercise were discussed.   He plays softball 2 times per week .   Depression screen: there are no signs or vegative symptoms of depression- irritability, change in appetite, anhedonia, sadness/tearfullness.  The following portions of the patient's history were reviewed and updated as appropriate: allergies, current medications, past family history, past medical history,  past surgical history, past social history  and problem list.  Visual acuity was not assessed per patient preference since she has regular follow up with her ophthalmologist. Hearing and body mass index were  assessed and reviewed.   During the course of the visit the patient was educated and counseled about appropriate screening and preventive services including : fall prevention , diabetes screening, nutrition counseling, colorectal cancer screening, and recommended immunizations.    CC: The primary encounter diagnosis was Obesity (BMI 30.0-34.9). Diagnoses of Encounter for preventive health examination, History of COVID-19, Tendonitis of ankle, right, and Chronic insomnia were also pertinent to this visit.  Mild COVID  infection In March .   Right ankle pain : persistent,  started after playing kickball a few months ago, pain is limited to the anterior dorsal side,    no bruising or swelling .  Aggravated by continued playing softball now.  Tired icing and rest which helped.   Fatigue:  he suffers from poor sleep chronically, watches TV in bed.  Used to be able to make up for it by sleeping later but  can no longer sleep in , gets up between 6 and 7 am.  Gets Sleepy during the day . Not exercising except for  community softball 2/week , but mostly just standing around . No nocturia.  Snores, per wife,  no  prior sleep study  Obesity: reviewed his weights over the last decade.  His personal best 220 lbs 8 years ago     History Jeremy Carpenter has a past medical history of Visit for preventive health examination (01/15/2015).   He has no past surgical history on file.   His family history includes Cancer in his maternal grandmother.He reports that  he quit smoking about 21 years ago. His smoking use included cigarettes. He started smoking about 25 years ago. He smoked an average of 1.00 packs per day. He has never used smokeless tobacco. He reports current alcohol use. He reports that he does not use drugs.  Outpatient Medications Prior to Visit  Medication Sig Dispense Refill   benzonatate (TESSALON) 100 MG capsule TAKE 1 CAPSULE (100 MG TOTAL) BY MOUTH EVERY 8 (EIGHT) HOURS FOR 5 DAYS. (Patient not taking:  Reported on 02/23/2021) 15 capsule 0   fluticasone (FLONASE) 50 MCG/ACT nasal spray Place 2 sprays into both nostrils daily. (Patient not taking: No sig reported) 16 g 0   fluticasone (FLONASE) 50 MCG/ACT nasal spray PLACE 2 SPRAYS INTO BOTH NOSTRILS DAILY. (Patient not taking: Reported on 02/23/2021) 16 g 0   No facility-administered medications prior to visit.    Review of Systems  Patient denies headache, fevers, malaise, unintentional weight loss, skin rash, eye pain, sinus congestion and sinus pain, sore throat, dysphagia,  hemoptysis , cough, dyspnea, wheezing, chest pain, palpitations, orthopnea, edema, abdominal pain, nausea, melena, diarrhea, constipation, flank pain, dysuria, hematuria, urinary  Frequency, nocturia, numbness, tingling, seizures,  Focal weakness, Loss of consciousness,  Tremor, insomnia, depression, anxiety, and suicidal ideation.     Objective:  BP 106/76 (BP Location: Left Arm, Patient Position: Sitting, Cuff Size: Large)   Pulse 75   Temp (!) 96.9 F (36.1 C) (Temporal)   Resp 14   Ht 5\' 10"  (1.778 m)   Wt 236 lb 9.6 oz (107.3 kg)   SpO2 97%   BMI 33.95 kg/m   Physical Exam  General appearance: alert, cooperative and appears stated age Ears: normal TM's and external ear canals both ears Throat: lips, mucosa, and tongue normal; teeth and gums normal Neck: no adenopathy, no carotid bruit, supple, symmetrical, trachea midline and thyroid not enlarged, symmetric, no tenderness/mass/nodules Back: symmetric, no curvature. ROM normal. No CVA tenderness. Lungs: clear to auscultation bilaterally Heart: regular rate and rhythm, S1, S2 normal, no murmur, click, rub or gallop Abdomen: soft, non-tender; bowel sounds normal; no masses,  no organomegaly Pulses: 2+ and symmetric Skin: Skin color, texture, turgor normal. No rashes or lesions Lymph nodes: Cervical, supraclavicular, and axillary nodes normal.   Assessment & Plan:   Problem List Items Addressed This  Visit       Unprioritized   Encounter for preventive health examination    age appropriate education and counseling updated, referrals for preventative services and immunizations addressed, dietary and smoking counseling addressed, most recent labs reviewed.  I have personally reviewed and have noted:   1) the patient's medical and social history 2) The pt's use of alcohol, tobacco, and illicit drugs 3) The patient's current medications and supplements 4) Functional ability including ADL's, fall risk, home safety risk, hearing and visual impairment 5) Diet and physical activities 6) Evidence for depression or mood disorder 7) The patient's height, weight, and BMI have been recorded in the chart   I have made referrals, and provided counseling and education based on review of the above       Obesity (BMI 30.0-34.9) - Primary    I have addressed  BMI and recommended a low glycemic index diet utilizing smaller more frequent meals to increase metabolism.  I have also recommended that patient start exercising with a goal of 30 minutes of aerobic exercise a minimum of 5 days per week. Screening for lipid disorders, thyroid and diabetes has been done today and labs  are normal   Lab Results  Component Value Date   TSH 2.17 02/23/2021   Lab Results  Component Value Date   HGBA1C 5.8 02/23/2021   Lab Results  Component Value Date   CHOL 194 02/23/2021   HDL 47.70 02/23/2021   LDLCALC 114 (H) 02/23/2021   TRIG 160.0 (H) 02/23/2021   CHOLHDL 4 02/23/2021            Relevant Orders   Lipid panel (Completed)   Comprehensive metabolic panel (Completed)   TSH (Completed)   Hemoglobin A1c (Completed)   History of COVID-19    Mild uncomplicated infection march 2022.         Tendonitis of ankle, right    Recommending meloxcam daily  Ice and rest from baseball.  Referral to orthopedics if no improvement/        Chronic insomnia    I recommend trying melatonin for his insomnia,  taken in the  evening after dinner starting  with 5 mg dose   Max effective dose is 10 mg.  he has also been encouraged to try using " Headspace".  This  is a phone app that teaches patients to meditate.  Indications for sleep study discussed         I have discontinued Jeremy Carpenter's fluticasone, benzonatate, and fluticasone. I am also having him start on meloxicam and omeprazole.  Meds ordered this encounter  Medications   meloxicam (MOBIC) 15 MG tablet    Sig: Take 1 tablet (15 mg total) by mouth daily.    Dispense:  30 tablet    Refill:  2   omeprazole (PRILOSEC) 40 MG capsule    Sig: Take 1 capsule (40 mg total) by mouth daily.    Dispense:  30 capsule    Refill:  3    Medications Discontinued During This Encounter  Medication Reason   benzonatate (TESSALON) 100 MG capsule    fluticasone (FLONASE) 50 MCG/ACT nasal spray    fluticasone (FLONASE) 50 MCG/ACT nasal spray     Follow-up: No follow-ups on file.   Sherlene Shams, MD

## 2021-02-23 NOTE — Patient Instructions (Addendum)
1) I recommend trying melatonin for your insomnia.  It is not a sedative,  But must be taken on  a regular basis to help your internal clock.  Take every evening after dinner start with 5 mg dose   Max effective dose is 10 mg  You can also try using " Headspace".  This  is a phone app that teaches you to meditate and empty your mind of thoughts that interfere with space    2) Your Ankle sprain  needs to be treated with an NSAID ,  ice and rest.  daily use of Meloxicam is advised INSTEAD OF ADVIL OR ALLEVE.   chronic daily use can cause gastritis , SO  you  should stake omeprazole to protect stomach from gastritis.  I   Ask Jeremy Carpenter about snoring and sleep apnea symptoms  Ask Jeremy Carpenter about which ankle doc she want you to see

## 2021-02-24 ENCOUNTER — Other Ambulatory Visit: Payer: Self-pay

## 2021-02-24 LAB — LIPID PANEL
Cholesterol: 194 mg/dL (ref 0–200)
HDL: 47.7 mg/dL (ref >39.00)
LDL Cholesterol: 114 mg/dL — ABNORMAL HIGH (ref 0–99)
NonHDL: 146.18
Total CHOL/HDL Ratio: 4
Triglycerides: 160 mg/dL — ABNORMAL HIGH (ref 0.0–149.0)
VLDL: 32 mg/dL (ref 0.0–40.0)

## 2021-02-24 LAB — COMPREHENSIVE METABOLIC PANEL
ALT: 23 U/L (ref 0–53)
AST: 24 U/L (ref 0–37)
Albumin: 4.6 g/dL (ref 3.5–5.2)
Alkaline Phosphatase: 82 U/L (ref 39–117)
BUN: 16 mg/dL (ref 6–23)
CO2: 28 mEq/L (ref 19–32)
Calcium: 10 mg/dL (ref 8.4–10.5)
Chloride: 105 mEq/L (ref 96–112)
Creatinine, Ser: 1.29 mg/dL (ref 0.40–1.50)
GFR: 67.31 mL/min (ref >60.00)
Glucose, Bld: 93 mg/dL (ref 70–99)
Potassium: 4.6 mEq/L (ref 3.5–5.1)
Sodium: 140 mEq/L (ref 135–145)
Total Bilirubin: 0.5 mg/dL (ref 0.2–1.2)
Total Protein: 7.3 g/dL (ref 6.0–8.3)

## 2021-02-24 LAB — HEMOGLOBIN A1C: Hgb A1c MFr Bld: 5.8 % (ref 4.6–6.5)

## 2021-02-24 LAB — TSH: TSH: 2.17 u[IU]/mL (ref 0.35–4.50)

## 2021-02-26 DIAGNOSIS — F5104 Psychophysiologic insomnia: Secondary | ICD-10-CM | POA: Insufficient documentation

## 2021-02-26 DIAGNOSIS — M7751 Other enthesopathy of right foot: Secondary | ICD-10-CM | POA: Insufficient documentation

## 2021-02-26 NOTE — Assessment & Plan Note (Signed)
I recommend trying melatonin for his insomnia, taken in the  evening after dinner starting  with 5 mg dose   Max effective dose is 10 mg.  he has also been encouraged to try using " Headspace".  This  is a phone app that teaches patients to meditate.  Indications for sleep study discussed

## 2021-02-26 NOTE — Assessment & Plan Note (Signed)
I have addressed  BMI and recommended a low glycemic index diet utilizing smaller more frequent meals to increase metabolism.  I have also recommended that patient start exercising with a goal of 30 minutes of aerobic exercise a minimum of 5 days per week. Screening for lipid disorders, thyroid and diabetes has been done today and labs are normal   Lab Results  Component Value Date   TSH 2.17 02/23/2021   Lab Results  Component Value Date   HGBA1C 5.8 02/23/2021   Lab Results  Component Value Date   CHOL 194 02/23/2021   HDL 47.70 02/23/2021   LDLCALC 114 (H) 02/23/2021   TRIG 160.0 (H) 02/23/2021   CHOLHDL 4 02/23/2021

## 2021-02-26 NOTE — Assessment & Plan Note (Signed)
Mild uncomplicated infection march 2022.

## 2021-02-26 NOTE — Assessment & Plan Note (Signed)
Recommending meloxcam daily  Ice and rest from baseball.  Referral to orthopedics if no improvement/

## 2021-02-26 NOTE — Assessment & Plan Note (Signed)

## 2021-06-27 ENCOUNTER — Ambulatory Visit (INDEPENDENT_AMBULATORY_CARE_PROVIDER_SITE_OTHER): Payer: No Typology Code available for payment source

## 2021-06-27 ENCOUNTER — Other Ambulatory Visit: Payer: Self-pay

## 2021-06-27 ENCOUNTER — Encounter: Payer: Self-pay | Admitting: Internal Medicine

## 2021-06-27 ENCOUNTER — Ambulatory Visit (INDEPENDENT_AMBULATORY_CARE_PROVIDER_SITE_OTHER): Payer: No Typology Code available for payment source | Admitting: Internal Medicine

## 2021-06-27 VITALS — BP 120/84 | HR 76 | Temp 96.8°F | Ht 70.0 in | Wt 236.6 lb

## 2021-06-27 DIAGNOSIS — M25512 Pain in left shoulder: Secondary | ICD-10-CM | POA: Diagnosis not present

## 2021-06-27 DIAGNOSIS — G8929 Other chronic pain: Secondary | ICD-10-CM

## 2021-06-27 MED ORDER — CELECOXIB 200 MG PO CAPS
200.0000 mg | ORAL_CAPSULE | Freq: Two times a day (BID) | ORAL | 0 refills | Status: DC
  Filled 2021-06-27: qty 60, 30d supply, fill #0

## 2021-06-27 NOTE — Patient Instructions (Addendum)
Your pain appears to be coming from an A/C joint strain (acromio clavicular joint)   Avoid sleeping on left shoulder  Avoid reaching over head and across your chest  Use ice applied for 15 minutes every very hours as able   Avoid lifting more than 20 lbs AND ONLY CLOSE TO THE BODY (not with an outstretched arm)   Celebrex instead of aleve twice daily PLUS   up to 2000 mg of acetominophen (tylenol) every day safely  In divided doses (500 mg every 6 hours  Or 1000 mg every 12 hours.)     REFERRAL TO [facility] IN PROGRESS

## 2021-06-27 NOTE — Progress Notes (Signed)
Subjective:  Patient ID: Jeremy Carpenter, male    DOB: 07-Feb-1976  Age: 45 y.o. MRN: [id number]  CC: The primary encounter diagnosis was Chronic left shoulder pain. A diagnosis of Arthralgia of left acromioclavicular joint was also pertinent to this visit.  HPI JOANNE BRANDER presents for evaluation of left shoulder pain  Chief Complaint  Patient presents with   Acute Visit    Left shoulder pain    This visit occurred during the SARS-CoV-2 public health emergency.  Safety protocols were in place, including screening questions prior to the visit, additional usage of staff PPE, and extensive cleaning of exam room while observing appropriate contact time as indicated for disinfecting solutions.   Mr Lagrand is a  right handed 45 yr old male with no previous history of injury to left shoulder who presents with new onset left shoulder pain with no precedent injury.  Has been persistent for the last 2 months ,  worse with activity.  Waxes and wanes.  Points to the clavicle and a/c joint  described as stabbing /sharp . Feels a bump on the top of the a/c  joint.  No bruising or redness.  Pain aggravated by all ROM ,  but worse with adduction of arm across his chest . Relieved somewhat by holding arm in a sling shape.  Hurts to sleep on same side.   Has tried ibuprofen  800 mg with no relief at all. Works maintenance .  The arm is aggravated by all activities of work .       Outpatient Medications Prior to Visit  Medication Sig Dispense Refill   meloxicam (MOBIC) 15 MG tablet Take 1 tablet (15 mg total) by mouth daily. (Patient not taking: Reported on 06/27/2021) 30 tablet 2   omeprazole (PRILOSEC) 40 MG capsule Take 1 capsule (40 mg total) by mouth daily. (Patient not taking: Reported on 06/27/2021) 30 capsule 3   No facility-administered medications prior to visit.    Review of Systems;  Patient denies headache, fevers, malaise, unintentional weight loss, skin rash, eye pain, sinus congestion  and sinus pain, sore throat, dysphagia,  hemoptysis , cough, dyspnea, wheezing, chest pain, palpitations, orthopnea, edema, abdominal pain, nausea, melena, diarrhea, constipation, flank pain, dysuria, hematuria, urinary  Frequency, nocturia, numbness, tingling, seizures,  Focal weakness, Loss of consciousness,  Tremor, insomnia, depression, anxiety, and suicidal ideation.      Objective:  BP 120/84 (BP Location: Left Arm, Patient Position: Sitting, Cuff Size: Large)   Pulse 76   Temp (!) 96.8 F (36 C) (Temporal)   Ht 5\' 10"  (1.778 m)   Wt 236 lb 9.6 oz (107.3 kg)   SpO2 97%   BMI 33.95 kg/m   BP Readings from Last 3 Encounters:  06/27/21 120/84  02/23/21 106/76  06/10/19 114/78    Wt Readings from Last 3 Encounters:  06/27/21 236 lb 9.6 oz (107.3 kg)  02/23/21 236 lb 9.6 oz (107.3 kg)  11/10/20 235 lb (106.6 kg)    General appearance: alert, cooperative and appears stated age Ears: normal TM's and external ear canals both ears Throat: lips, mucosa, and tongue normal; teeth and gums normal Neck: no adenopathy, no carotid bruit, supple, symmetrical, trachea midline and thyroid not enlarged, symmetric, no tenderness/mass/nodules Left shoulder: tender over AC joint,  mild swelling noted.  No biceps/triceps/deltoid tenderness.  No pain with passive ROM.  Back: symmetric, no curvature. ROM normal. No CVA tenderness. Lungs: clear to auscultation bilaterally Heart: regular rate and rhythm, S1,  S2 normal, no murmur, click, rub or gallop Abdomen: soft, non-tender; bowel sounds normal; no masses,  no organomegaly Pulses: 2+ and symmetric Skin: Skin color, texture, turgor normal. No rashes or lesions Lymph nodes: Cervical, supraclavicular, and axillary nodes normal.  Lab Results  Component Value Date   HGBA1C 5.8 02/23/2021    Lab Results  Component Value Date   CREATININE 1.29 02/23/2021   CREATININE 1.16 02/01/2018   CREATININE 1.41 (H) 01/15/2015    Lab Results  Component  Value Date   WBC 6.7 02/01/2018   HGB 14.5 02/01/2018   HCT 42.6 02/01/2018   PLT 256 02/01/2018   GLUCOSE 93 02/23/2021   CHOL 194 02/23/2021   TRIG 160.0 (H) 02/23/2021   HDL 47.70 02/23/2021   LDLCALC 114 (H) 02/23/2021   ALT 23 02/23/2021   AST 24 02/23/2021   NA 140 02/23/2021   K 4.6 02/23/2021   CL 105 02/23/2021   CREATININE 1.29 02/23/2021   BUN 16 02/23/2021   CO2 28 02/23/2021   TSH 2.17 02/23/2021   HGBA1C 5.8 02/23/2021   MICROALBUR 0.5 02/01/2018    No results found.  Assessment & Plan:   Problem List Items Addressed This Visit       Unprioritized   AC joint pain    Exam and history suggests a sprain of the AC joint.  NSAIDS,  Rest,and tylenol prescribed.  Plain films ordered to evaluate joint space.       Relevant Orders   Ambulatory referral to Orthopedic Surgery   Other Visit Diagnoses     Chronic left shoulder pain    -  Primary   Relevant Medications   celecoxib (CELEBREX) 200 MG capsule   Other Relevant Orders   DG Shoulder Left   Ambulatory referral to Orthopedic Surgery       I have discontinued Bryn A. Rounds's meloxicam and omeprazole. I am also having him start on celecoxib.  Meds ordered this encounter  Medications   celecoxib (CELEBREX) 200 MG capsule    Sig: Take 1 capsule (200 mg total) by mouth 2 (two) times daily.    Dispense:  60 capsule    Refill:  0    Medications Discontinued During This Encounter  Medication Reason   meloxicam (MOBIC) 15 MG tablet    omeprazole (PRILOSEC) 40 MG capsule     Follow-up: No follow-ups on file.   Sherlene Shams, MD

## 2021-06-28 DIAGNOSIS — M25519 Pain in unspecified shoulder: Secondary | ICD-10-CM | POA: Insufficient documentation

## 2021-06-28 NOTE — Assessment & Plan Note (Signed)
Exam and history suggests a sprain of the [facility] joint.  NSAIDS,  Rest,and tylenol prescribed.  Plain films ordered to evaluate joint space.

## 2021-08-17 ENCOUNTER — Encounter: Payer: Self-pay | Admitting: Adult Health

## 2021-08-17 ENCOUNTER — Telehealth (INDEPENDENT_AMBULATORY_CARE_PROVIDER_SITE_OTHER): Payer: No Typology Code available for payment source | Admitting: Adult Health

## 2021-08-17 VITALS — Wt 225.0 lb

## 2021-08-17 DIAGNOSIS — J069 Acute upper respiratory infection, unspecified: Secondary | ICD-10-CM

## 2021-08-17 NOTE — Progress Notes (Signed)
Virtual Visit via Video Note  I connected with ZAKEE DEERMAN on 08/17/21 at  1:30 PM EST by a video enabled telemedicine application and verified that I am speaking with the correct person using two identifiers.  Location: Patient: at home  Provider: Provider: Provider's office at  [facility], [place].      I discussed the limitations of evaluation and management by telemedicine and the availability of in person appointments. The patient expressed understanding and agreed to proceed.  History of Present Illness:  Patient is calling in with cough, sore throat, congestion, denies any ear pain. Head and nasal congestion.  Onset 09/14/2020. He has been trying sudafed over the counter.   Denies any breathing problems. Afebrile.    Patient  denies any body aches,chills, rash, chest pain, shortness of breath, nausea, vomiting, or diarrhea.    Denies dizziness, lightheadedness, pre syncopal or syncopal episodes.   Observations/Objective:    Patient is alert and oriented and responsive to questions Engages in conversation with provider. Speaks in full sentences without any pauses without any shortness of breath or distress.   Assessment and Plan:  Viral upper respiratory tract infection - Plan: COVID-19, Flu A+B and RSV  Patient will come for parking lot visit for flu COVID RSV this afternoon.  Discussed with him that the symptoms sound like a respiratory virus at this point discussed viral versus bacterial and no need for antibiotic at this time.  He will continue symptomatic treatment.  He can add Mucinex.  He can continue Sudafed.  Patient has no questions at this time.  Patient also advised to use nasal saline.  Follow Up Instructions:   Advised in person evaluation at anytime is advised if any symptoms do not improve, worsen or change at any given time.  Red Flags discussed. The patient was given clear instructions to go to ER or return to medical  center if any red flags develop, symptoms do not improve, worsen or new problems develop. They verbalized understanding.    I discussed the assessment and treatment plan with the patient. The patient was provided an opportunity to ask questions and all were answered. The patient agreed with the plan and demonstrated an understanding of the instructions.   The patient was advised to call back or seek an in-person evaluation if the symptoms worsen or if the condition fails to improve as anticipated.  Return in about 3 months (around 11/15/2021), or if symptoms worsen or fail to improve, for at any time for any worsening symptoms, Go to Emergency room/ urgent care if worse.    Jairo Ben, FNP

## 2021-08-17 NOTE — Patient Instructions (Signed)

## 2021-08-18 ENCOUNTER — Telehealth: Payer: Self-pay

## 2021-08-18 LAB — COVID-19, FLU A+B AND RSV
Influenza A, NAA: NOT DETECTED
Influenza B, NAA: NOT DETECTED
RSV, NAA: NOT DETECTED
SARS-CoV-2, NAA: NOT DETECTED

## 2021-08-18 NOTE — Telephone Encounter (Signed)
LMTCB & also let patient know results available through mychart.

## 2021-08-22 ENCOUNTER — Other Ambulatory Visit: Payer: Self-pay

## 2021-08-22 MED ORDER — CELECOXIB 100 MG PO CAPS
ORAL_CAPSULE | ORAL | 1 refills | Status: DC
  Filled 2021-08-22: qty 60, 60d supply, fill #0

## 2021-10-11 DIAGNOSIS — H524 Presbyopia: Secondary | ICD-10-CM | POA: Diagnosis not present

## 2022-02-07 ENCOUNTER — Other Ambulatory Visit: Payer: Self-pay

## 2022-02-07 MED ORDER — CLINDAMYCIN HCL 300 MG PO CAPS
ORAL_CAPSULE | ORAL | 0 refills | Status: DC
  Filled 2022-02-07: qty 30, 7d supply, fill #0

## 2022-02-07 MED ORDER — IBUPROFEN 800 MG PO TABS
ORAL_TABLET | ORAL | 0 refills | Status: DC
  Filled 2022-02-07: qty 16, 4d supply, fill #0

## 2022-07-19 ENCOUNTER — Ambulatory Visit (INDEPENDENT_AMBULATORY_CARE_PROVIDER_SITE_OTHER): Payer: BC Managed Care – PPO | Admitting: Internal Medicine

## 2022-07-19 ENCOUNTER — Encounter: Payer: Self-pay | Admitting: Internal Medicine

## 2022-07-19 VITALS — BP 110/74 | HR 72 | Temp 98.3°F | Ht 70.0 in | Wt 225.2 lb

## 2022-07-19 DIAGNOSIS — E785 Hyperlipidemia, unspecified: Secondary | ICD-10-CM | POA: Diagnosis not present

## 2022-07-19 DIAGNOSIS — R7301 Impaired fasting glucose: Secondary | ICD-10-CM | POA: Diagnosis not present

## 2022-07-19 DIAGNOSIS — Z23 Encounter for immunization: Secondary | ICD-10-CM

## 2022-07-19 DIAGNOSIS — R5383 Other fatigue: Secondary | ICD-10-CM

## 2022-07-19 DIAGNOSIS — Z1211 Encounter for screening for malignant neoplasm of colon: Secondary | ICD-10-CM

## 2022-07-19 DIAGNOSIS — R7303 Prediabetes: Secondary | ICD-10-CM | POA: Diagnosis not present

## 2022-07-19 DIAGNOSIS — E669 Obesity, unspecified: Secondary | ICD-10-CM

## 2022-07-19 DIAGNOSIS — Z0000 Encounter for general adult medical examination without abnormal findings: Secondary | ICD-10-CM

## 2022-07-19 LAB — CBC WITH DIFFERENTIAL/PLATELET
Basophils Absolute: 0 10*3/uL (ref 0.0–0.1)
Basophils Relative: 0.6 % (ref 0.0–3.0)
Eosinophils Absolute: 0 10*3/uL (ref 0.0–0.7)
Eosinophils Relative: 0.7 % (ref 0.0–5.0)
HCT: 44.9 % (ref 39.0–52.0)
Hemoglobin: 14.8 g/dL (ref 13.0–17.0)
Lymphocytes Relative: 29.9 % (ref 12.0–46.0)
Lymphs Abs: 1.6 10*3/uL (ref 0.7–4.0)
MCHC: 32.8 g/dL (ref 30.0–36.0)
MCV: 86.4 fl (ref 78.0–100.0)
Monocytes Absolute: 0.6 10*3/uL (ref 0.1–1.0)
Monocytes Relative: 10.7 % (ref 3.0–12.0)
Neutro Abs: 3.1 10*3/uL (ref 1.4–7.7)
Neutrophils Relative %: 58.1 % (ref 43.0–77.0)
Platelets: 254 10*3/uL (ref 150.0–400.0)
RBC: 5.2 Mil/uL (ref 4.22–5.81)
RDW: 12.5 % (ref 11.5–15.5)
WBC: 5.4 10*3/uL (ref 4.0–10.5)

## 2022-07-19 LAB — COMPREHENSIVE METABOLIC PANEL
ALT: 16 U/L (ref 0–53)
AST: 16 U/L (ref 0–37)
Albumin: 4.6 g/dL (ref 3.5–5.2)
Alkaline Phosphatase: 79 U/L (ref 39–117)
BUN: 16 mg/dL (ref 6–23)
CO2: 30 mEq/L (ref 19–32)
Calcium: 9.8 mg/dL (ref 8.4–10.5)
Chloride: 103 mEq/L (ref 96–112)
Creatinine, Ser: 1.12 mg/dL (ref 0.40–1.50)
GFR: 78.97 mL/min (ref >60.00)
Glucose, Bld: 87 mg/dL (ref 70–99)
Potassium: 4.3 mEq/L (ref 3.5–5.1)
Sodium: 137 mEq/L (ref 135–145)
Total Bilirubin: 0.5 mg/dL (ref 0.2–1.2)
Total Protein: 6.9 g/dL (ref 6.0–8.3)

## 2022-07-19 LAB — LIPID PANEL
Cholesterol: 199 mg/dL (ref 0–200)
HDL: 43.8 mg/dL (ref >39.00)
LDL Cholesterol: 126 mg/dL — ABNORMAL HIGH (ref 0–99)
NonHDL: 155.63
Total CHOL/HDL Ratio: 5
Triglycerides: 150 mg/dL — ABNORMAL HIGH (ref 0.0–149.0)
VLDL: 30 mg/dL (ref 0.0–40.0)

## 2022-07-19 LAB — HEMOGLOBIN A1C: Hgb A1c MFr Bld: 5.9 % (ref 4.6–6.5)

## 2022-07-19 LAB — LDL CHOLESTEROL, DIRECT: Direct LDL: 131 mg/dL

## 2022-07-19 NOTE — Assessment & Plan Note (Signed)
I have addressed  BMI and recommended a low glycemic index diet utilizing smaller more frequent meals to increase metabolism.  I have also recommended that patient start exercising with a goal of 30 minutes of aerobic exercise a minimum of 5 days per week. Screening for lipid disorders, thyroid and diabetes to be done.  Lab Results  Component Value Date   TSH 2.17 02/23/2021   Lab Results  Component Value Date   HGBA1C 5.9 07/19/2022   Lab Results  Component Value Date   CHOL 199 07/19/2022   HDL 43.80 07/19/2022   LDLCALC 126 (H) 07/19/2022   LDLDIRECT 131.0 07/19/2022   TRIG 150.0 (H) 07/19/2022   CHOLHDL 5 07/19/2022

## 2022-07-19 NOTE — Patient Instructions (Signed)
  I will initiate the order for your colon cancer screening  Test, the one called  Cologuard.  It will be delivered to your house, and you will send off a stool sample in the envelope it provides.      

## 2022-07-19 NOTE — Assessment & Plan Note (Signed)

## 2022-07-19 NOTE — Progress Notes (Signed)
The patient is here for annual preventive examination and management of other chronic and acute problems.   The risk factors are reflected in the social history.   The roster of all physicians providing medical care to patient - is listed in the Snapshot section of the chart.   Activities of daily living:  The patient is 100% independent in all ADLs: dressing, toileting, feeding as well as independent mobility   Home safety : The patient has smoke detectors in the home. They wear seatbelts.  There are no unsecured firearms at home. There is no violence in the home.    There is no risks for hepatitis, STDs or HIV. There is no   history of blood transfusion. They have no travel history to infectious disease endemic areas of the world.   The patient has seen their dentist in the last six month. They have seen their eye doctor in the last year. The patinet  denies slight hearing difficulty with regard to whispered voices and some television programs.  They have deferred audiologic testing in the last year.  They do not  have excessive sun exposure. Discussed the need for sun protection: hats, long sleeves and use of sunscreen if there is significant sun exposure.    Diet: the importance of a healthy diet is discussed. They do have a healthy diet.   The benefits of regular aerobic exercise were discussed. The patient  exercises  3 to 5 days per week  for  60 minutes.    Depression screen: there are no signs or vegative symptoms of depression- irritability, change in appetite, anhedonia, sadness/tearfullness.   The following portions of the patient's history were reviewed and updated as appropriate: allergies, current medications, past family history, past medical history,  past surgical history, past social history  and problem list.   Visual acuity was not assessed per patient preference since the patient has regular follow up with an  ophthalmologist. Hearing and body mass index were assessed and  reviewed.    During the course of the visit the patient was educated and counseled about appropriate screening and preventive services including : fall prevention , diabetes screening, nutrition counseling, colorectal cancer screening, and recommended immunizations.    Chief Complaint:  none   Review of Symptoms  Patient denies headache, fevers, malaise, unintentional weight loss, skin rash, eye pain, sinus congestion and sinus pain, sore throat, dysphagia,  hemoptysis , cough, dyspnea, wheezing, chest pain, palpitations, orthopnea, edema, abdominal pain, nausea, melena, diarrhea, constipation, flank pain, dysuria, hematuria, urinary  Frequency, nocturia, numbness, tingling, seizures,  Focal weakness, Loss of consciousness,  Tremor, insomnia, depression, anxiety, and suicidal ideation.    Physical Exam:  BP 110/74 (BP Location: Left Arm, Patient Position: Sitting, Cuff Size: Large)   Pulse 72   Temp 98.3 F (36.8 C) (Oral)   Ht 5\' 10"  (1.778 m)   Wt 225 lb 3.2 oz (102.2 kg)   SpO2 96%   BMI 32.31 kg/m    General appearance: alert, cooperative and appears stated age Ears: normal TM's and external ear canals both ears Throat: lips, mucosa, and tongue normal; teeth and gums normal Neck: no adenopathy, no carotid bruit, supple, symmetrical, trachea midline and thyroid not enlarged, symmetric, no tenderness/mass/nodules Back: symmetric, no curvature. ROM normal. No CVA tenderness. Lungs: clear to auscultation bilaterally Heart: regular rate and rhythm, S1, S2 normal, no murmur, click, rub or gallop Abdomen: soft, non-tender; bowel sounds normal; no masses,  no organomegaly Pulses: 2+ and symmetric  Skin: Skin color, texture, turgor normal. No rashes or lesions Lymph nodes: Cervical, supraclavicular, and axillary nodes normal.   Assessment and Plan:  Encounter for preventive health examination age appropriate education and counseling updated, referrals for preventative services and  immunizations addressed, dietary and smoking counseling addressed, most recent labs reviewed.  I have personally reviewed and have noted:   1) the patient's medical and social history 2) The pt's use of alcohol, tobacco, and illicit drugs 3) The patient's current medications and supplements 4) Functional ability including ADL's, fall risk, home safety risk, hearing and visual impairment 5) Diet and physical activities 6) Evidence for depression or mood disorder 7) The patient's height, weight, and BMI have been recorded in the chart  I have made referrals, and provided counseling and education based on review of the above   Obesity (BMI 30.0-34.9) I have addressed  BMI and recommended a low glycemic index diet utilizing smaller more frequent meals to increase metabolism.  I have also recommended that patient start exercising with a goal of 30 minutes of aerobic exercise a minimum of 5 days per week. Screening for lipid disorders, thyroid and diabetes to be done.  Lab Results  Component Value Date   TSH 2.17 02/23/2021   Lab Results  Component Value Date   HGBA1C 5.9 07/19/2022   Lab Results  Component Value Date   CHOL 199 07/19/2022   HDL 43.80 07/19/2022   LDLCALC 126 (H) 07/19/2022   LDLDIRECT 131.0 07/19/2022   TRIG 150.0 (H) 07/19/2022   CHOLHDL 5 07/19/2022       Updated Medication List Outpatient Encounter Medications as of 07/19/2022  Medication Sig   [DISCONTINUED] celecoxib (CELEBREX) 100 MG capsule Take 1 capsule every day by oral route. (Patient not taking: Reported on 07/19/2022)   [DISCONTINUED] celecoxib (CELEBREX) 200 MG capsule Take 1 capsule (200 mg total) by mouth 2 (two) times daily. (Patient not taking: Reported on 08/17/2021)   [DISCONTINUED] clindamycin (CLEOCIN) 300 MG capsule Take 1 capsule by mouth every 6 hours until gone   [DISCONTINUED] ibuprofen (ADVIL) 800 MG tablet Take 1 tablet by mouth every 6 to 8 hours as needed for pain   No  facility-administered encounter medications on file as of 07/19/2022.

## 2022-07-22 NOTE — Addendum Note (Signed)
Addended by: Sherlene Shams on: 07/22/2022 08:01 AM   Modules accepted: Level of Service

## 2022-07-24 DIAGNOSIS — Z1211 Encounter for screening for malignant neoplasm of colon: Secondary | ICD-10-CM | POA: Diagnosis not present

## 2022-08-03 LAB — COLOGUARD: COLOGUARD: NEGATIVE

## 2022-10-24 DIAGNOSIS — H524 Presbyopia: Secondary | ICD-10-CM | POA: Diagnosis not present

## 2023-02-17 IMAGING — DX DG SHOULDER 2+V*L*
3 series · 3 of 3 positions shown · non-contrast
Comparison: None.

CLINICAL DATA: Chronic left shoulder pain. Two months of
acromioclavicular joint pain, worse with abduction. No known injury.

EXAM:
LEFT SHOULDER - 2+ VIEW

[shoulder (grashey) ap]
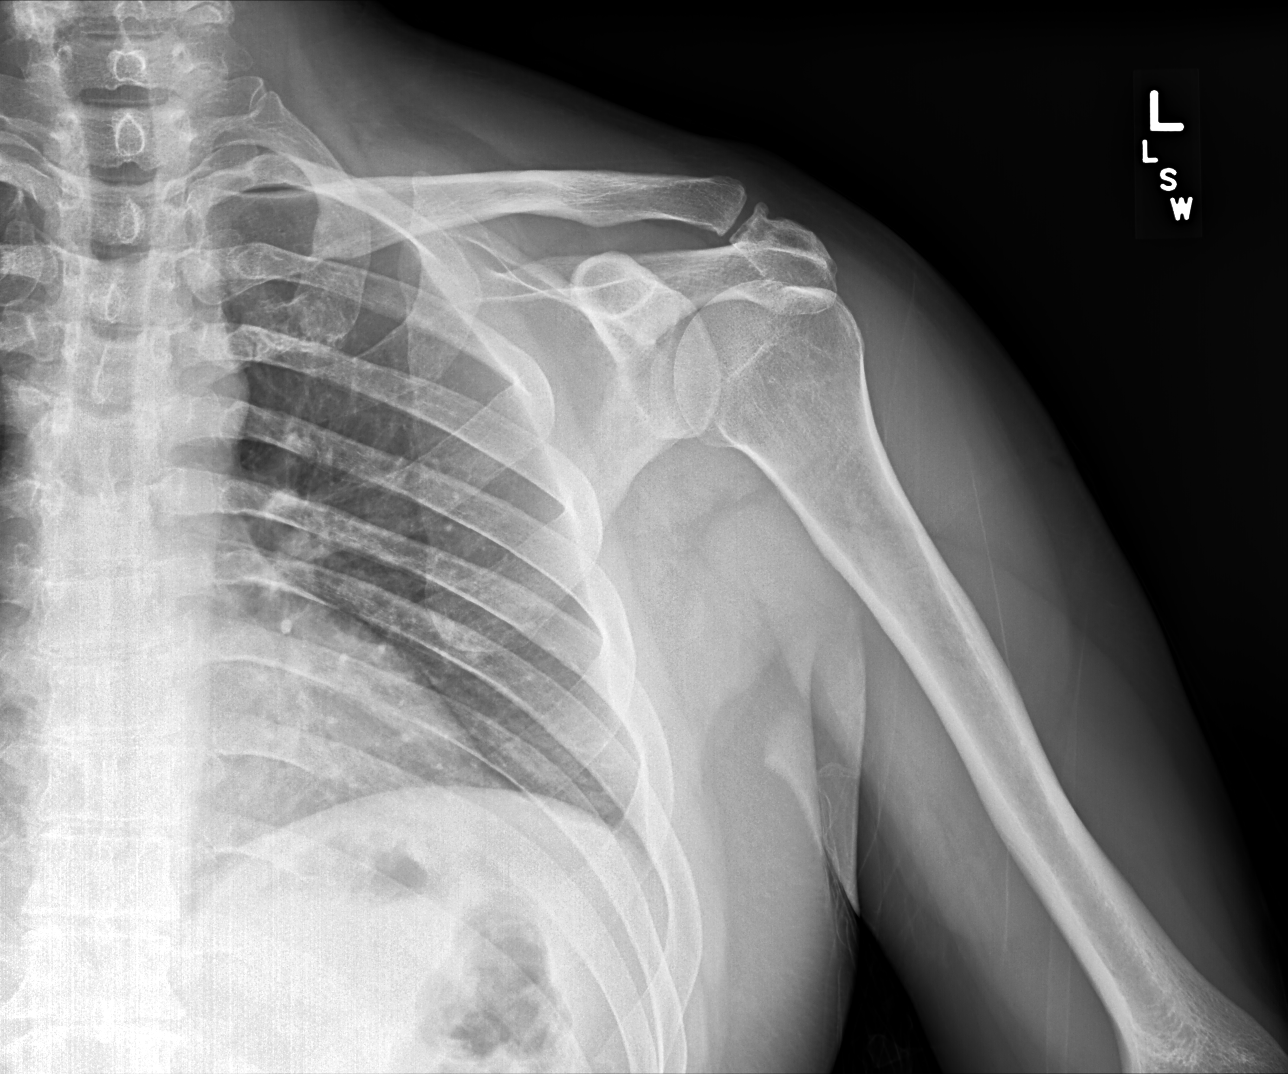

[shoulder y view]
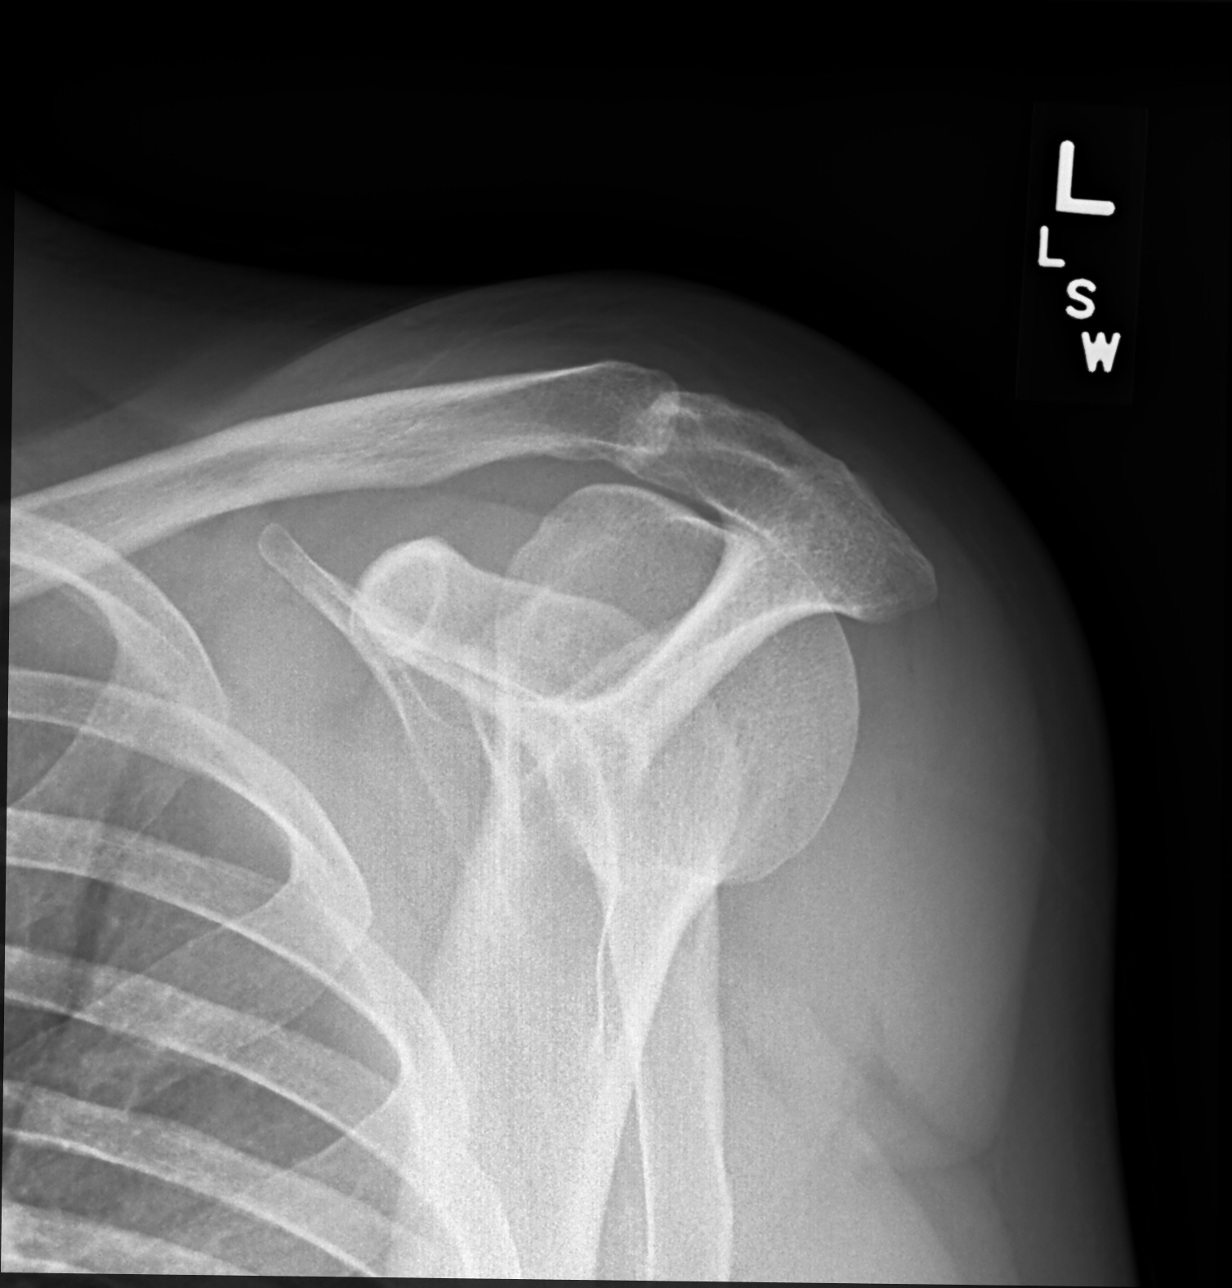

[shoulder axillary pa]
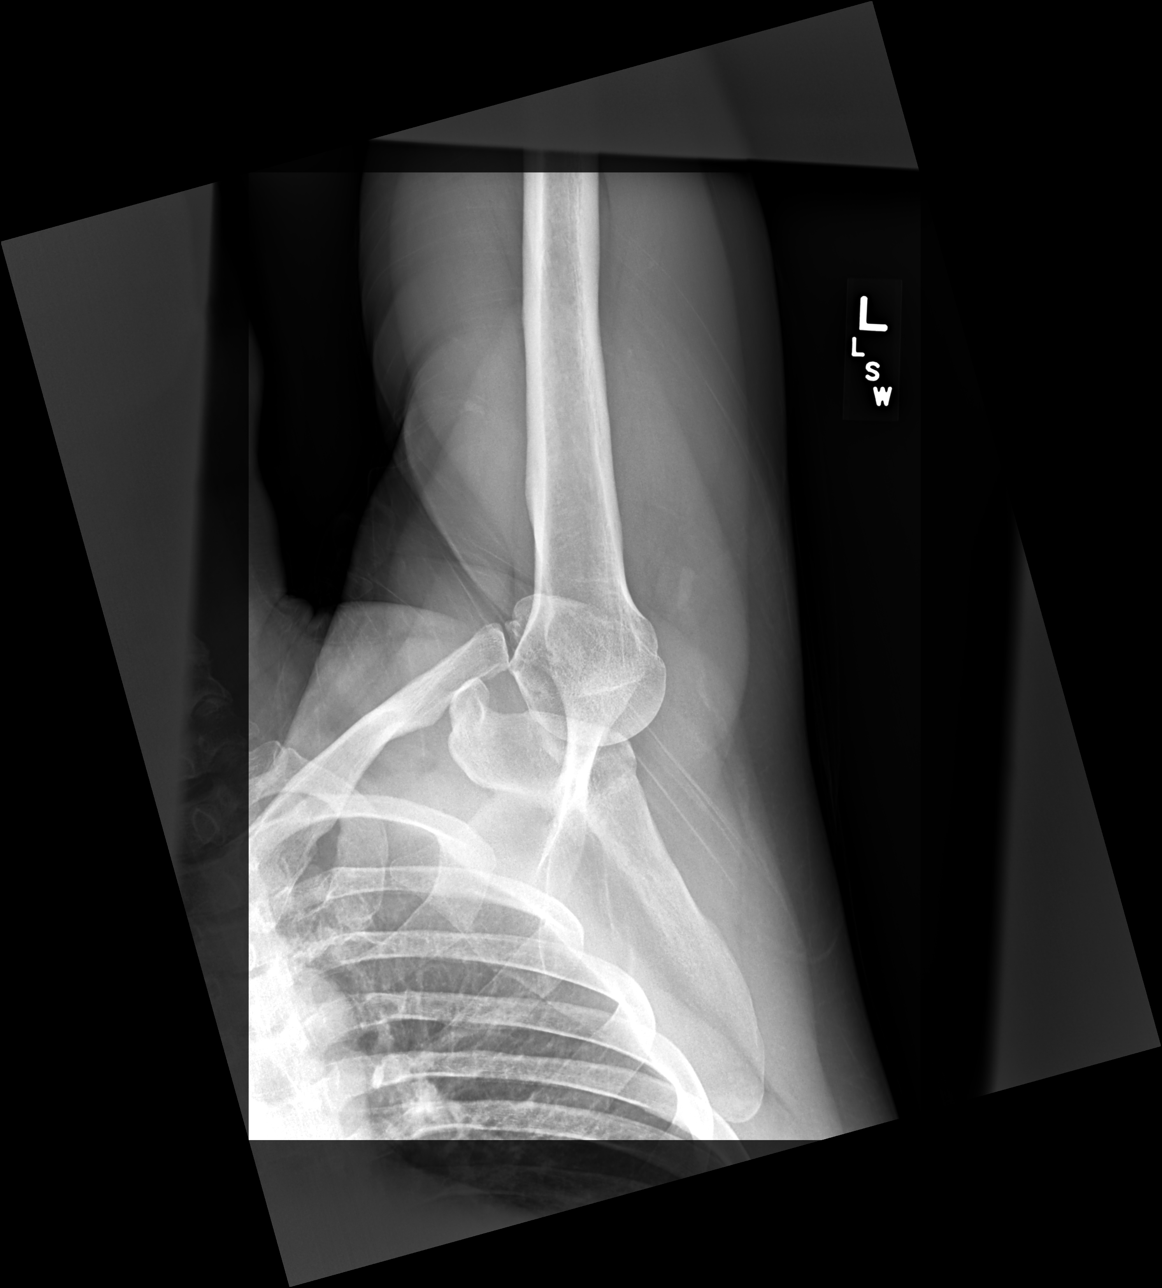

[3 of 3 positions shown; findings below may reference images not displayed]

FINDINGS: Mild acromioclavicular spurring with inferior and superiorly
directed osteophytes. Tiny subacromial spur. No fracture. Normal
alignment. No evidence of os acromial. No erosion or focal bone
abnormality. No soft tissue calcifications. No acute findings in the
included left chest.
IMPRESSION: Mild acromioclavicular osteoarthritis. Tiny subacromial spur.

## 2023-05-24 ENCOUNTER — Ambulatory Visit (INDEPENDENT_AMBULATORY_CARE_PROVIDER_SITE_OTHER): Payer: BC Managed Care – PPO | Admitting: Nurse Practitioner

## 2023-05-24 ENCOUNTER — Other Ambulatory Visit: Payer: Self-pay

## 2023-05-24 VITALS — BP 122/82 | HR 78 | Temp 98.5°F | Ht 70.0 in | Wt 221.2 lb

## 2023-05-24 DIAGNOSIS — U071 COVID-19: Secondary | ICD-10-CM | POA: Diagnosis not present

## 2023-05-24 DIAGNOSIS — J029 Acute pharyngitis, unspecified: Secondary | ICD-10-CM | POA: Diagnosis not present

## 2023-05-24 HISTORY — DX: COVID-19: U07.1

## 2023-05-24 LAB — POC COVID19 BINAXNOW: SARS Coronavirus 2 Ag: POSITIVE — AB

## 2023-05-24 MED ORDER — BENZONATATE 100 MG PO CAPS
100.0000 mg | ORAL_CAPSULE | Freq: Three times a day (TID) | ORAL | 0 refills | Status: DC | PRN
  Filled 2023-05-24: qty 30, 10d supply, fill #0

## 2023-05-24 MED ORDER — PREDNISONE 20 MG PO TABS
40.0000 mg | ORAL_TABLET | Freq: Every day | ORAL | 0 refills | Status: DC
  Filled 2023-05-24: qty 10, 5d supply, fill #0

## 2023-05-24 NOTE — Progress Notes (Signed)
Established Patient Office Visit  Subjective:  Patient ID: Jeremy Carpenter, male    DOB: 1976/06/29  Age: 47 y.o. MRN: [id number]  CC:  Chief Complaint  Patient presents with   Acute Visit    Sore throat, headache, cough & congestion since Sunday  Sore throat is gone now    HPI  Jeremy Carpenter presents for sore throat, headache, cough and congestion since last 4 days. He states the symptoms are getting better now. [name] SOB, chest pain, fever.   Medication: Sudafed, tylenol.  HPI   Past Medical History:  Diagnosis Date   Visit for preventive health examination 01/15/2015    History reviewed. No pertinent surgical history.  Family History  Problem Relation Age of Onset   Cancer Maternal Grandmother     Social History   Socioeconomic History   Marital status: Married    Spouse name: Not on file   Number of children: Not on file   Years of education: Not on file   Highest education level: 12th grade  Occupational History   Not on file  Tobacco Use   Smoking status: Former    Current packs/day: 0.00    Average packs/day: 1 pack/day for 4.0 years (4.0 ttl pk-yrs)    Types: Cigarettes    Start date: 01/15/1996    Quit date: 01/15/2000    Years since quitting: 23.3   Smokeless tobacco: Never  Substance and Sexual Activity   Alcohol use: Yes    Alcohol/week: 0.0 standard drinks of alcohol   Drug use: No   Sexual activity: Yes  Other Topics Concern   Not on file  Social History Narrative   Not on file   Social Determinants of Health   Financial Resource Strain: Low Risk  (05/24/2023)   Overall Financial Resource Strain (CARDIA)    Difficulty of Paying Living Expenses: Not hard at all  Food Insecurity: No Food Insecurity (05/24/2023)   Hunger Vital Sign    Worried About Running Out of Food in the Last Year: Never true    Ran Out of Food in the Last Year: Never true  Transportation Needs: No Transportation Needs (05/24/2023)   PRAPARE - Scientist, research (physical sciences) (Medical): No    Lack of Transportation (Non-Medical): No  Physical Activity: Insufficiently Active (05/24/2023)   Exercise Vital Sign    Days of Exercise per Week: 1 day    Minutes of Exercise per Session: 30 min  Stress: No Stress Concern Present (05/24/2023)   [employer] of Occupational Health - Occupational Stress Questionnaire    Feeling of Stress : Only a little  Social Connections: Moderately Integrated (05/24/2023)   Social Connection and Isolation Panel [NHANES]    Frequency of Communication with Friends and Family: Twice a week    Frequency of Social Gatherings with Friends and Family: Once a week    Attends Religious Services: 1 to 4 times per year    Active Member of [employer] or Organizations: No    Attends Engineer, structural: Not on file    Marital Status: Married  Catering manager Violence: Not on file     Outpatient Medications Prior to Visit  Medication Sig Dispense Refill   celecoxib (CELEBREX) 100 MG capsule Take 1 capsule every day by oral route.     No facility-administered medications prior to visit.    No Known Allergies  ROS Review of Systems Negative unless indicated in HPI.    Objective:  Physical Exam Constitutional:      Appearance: Normal appearance.  HENT:     Right Ear: Tympanic membrane normal. Tympanic membrane is not erythematous.     Left Ear: Tympanic membrane normal. Tympanic membrane is not erythematous.     Nose:     Right Turbinates: Not enlarged.     Left Turbinates: Not enlarged.     Right Sinus: Maxillary sinus tenderness present. No frontal sinus tenderness.     Left Sinus: Maxillary sinus tenderness present. No frontal sinus tenderness.     Mouth/Throat:     Mouth: Mucous membranes are moist.     Pharynx: Posterior oropharyngeal erythema present. No pharyngeal swelling or oropharyngeal exudate.     Tonsils: No tonsillar exudate.  Cardiovascular:     Rate and Rhythm: Normal rate and regular  rhythm.  Pulmonary:     Effort: Pulmonary effort is normal.     Breath sounds: Normal breath sounds. No stridor. No wheezing.  Neurological:     General: No focal deficit present.     Mental Status: He is alert and oriented to person, place, and time. Mental status is at baseline.  Psychiatric:        Mood and Affect: Mood normal.        Behavior: Behavior normal.        Thought Content: Thought content normal.        Judgment: Judgment normal.     BP 122/82   Pulse 78   Temp 98.5 F (36.9 C)   Ht 5\' 10"  (1.778 m)   Wt 221 lb 3.2 oz (100.3 kg)   SpO2 97%   BMI 31.74 kg/m  Wt Readings from Last 3 Encounters:  05/24/23 221 lb 3.2 oz (100.3 kg)  07/19/22 225 lb 3.2 oz (102.2 kg)  08/17/21 225 lb (102.1 kg)     Health Maintenance  Topic Date Due   HIV Screening  Never done   Colonoscopy  Never done   COVID-19 Vaccine (3 - 2023-24 season) 06/09/2023 (Originally 05/13/2023)   INFLUENZA VACCINE  12/10/2023 (Originally 04/12/2023)   DTaP/Tdap/Td (2 - Td or Tdap) 01/14/2025   Hepatitis C Screening  Completed   HPV VACCINES  Aged Out    There are no preventive care reminders to display for this patient.  Lab Results  Component Value Date   TSH 2.17 02/23/2021   Lab Results  Component Value Date   WBC 5.4 07/19/2022   HGB 14.8 07/19/2022   HCT 44.9 07/19/2022   MCV 86.4 07/19/2022   PLT 254.0 07/19/2022   Lab Results  Component Value Date   NA 137 07/19/2022   K 4.3 07/19/2022   CO2 30 07/19/2022   GLUCOSE 87 07/19/2022   BUN 16 07/19/2022   CREATININE 1.12 07/19/2022   BILITOT 0.5 07/19/2022   ALKPHOS 79 07/19/2022   AST 16 07/19/2022   ALT 16 07/19/2022   PROT 6.9 07/19/2022   ALBUMIN 4.6 07/19/2022   CALCIUM 9.8 07/19/2022   GFR 78.97 07/19/2022   Lab Results  Component Value Date   CHOL 199 07/19/2022   Lab Results  Component Value Date   HDL 43.80 07/19/2022   Lab Results  Component Value Date   LDLCALC 126 (H) 07/19/2022   Lab Results   Component Value Date   TRIG 150.0 (H) 07/19/2022   Lab Results  Component Value Date   CHOLHDL 5 07/19/2022   Lab Results  Component Value Date   HGBA1C 5.9 07/19/2022  Assessment & Plan:  COVID-19 Assessment & Plan: Maxillary sinus tenderness. POCT COVID positive. Symptomatic treatment discussed.  Will treat with prednisone and benzonatate. Advised to increase fluid intake and rest. Advised to use humidifier and steam.    Sorethroat -     POC COVID-19 BinaxNow  Other orders -     predniSONE; Take 2 tablets (40 mg total) by mouth daily with breakfast.  Dispense: 10 tablet; Refill: 0 -     Benzonatate; Take 1 capsule (100 mg total) by mouth 3 (three) times daily as needed.  Dispense: 30 capsule; Refill: 0    Follow-up: Return if symptoms worsen or fail to improve.   Kara Dies, NP

## 2023-05-24 NOTE — Assessment & Plan Note (Addendum)
Maxillary sinus tenderness. POCT COVID positive. Symptomatic treatment discussed.  Will treat with prednisone and benzonatate. Advised to increase fluid intake and rest. Advised to use humidifier and steam.

## 2023-05-24 NOTE — Patient Instructions (Signed)
Isolation Instructions: You are to isolate at home for 5 days from onset of your symptoms. If you must be around other household members who do not have symptoms, you need to make sure that both you and the family members are masking consistently with a high-quality mask.   After day 5 of isolation, if you have had no fever within 24 hours and you are feeling better, you can end isolation but need to mask for an additional 5 days.   After day 5 if you have a fever or are having significant symptoms, please isolate for full 10 days.   If you note any worsening of symptoms despite treatment, please seek an in-person evaluation ASAP. If you note any significant shortness of breath or any chest pain, please seek ER evaluation.   Rx sent to pharmacy.

## 2023-05-27 ENCOUNTER — Encounter: Payer: Self-pay | Admitting: Nurse Practitioner

## 2023-07-27 ENCOUNTER — Encounter: Payer: Self-pay | Admitting: Internal Medicine

## 2023-07-27 ENCOUNTER — Ambulatory Visit (INDEPENDENT_AMBULATORY_CARE_PROVIDER_SITE_OTHER): Payer: BC Managed Care – PPO | Admitting: Internal Medicine

## 2023-07-27 VITALS — BP 118/84 | HR 77 | Temp 98.0°F | Ht 70.0 in | Wt 220.4 lb

## 2023-07-27 DIAGNOSIS — Z0000 Encounter for general adult medical examination without abnormal findings: Secondary | ICD-10-CM | POA: Diagnosis not present

## 2023-07-27 DIAGNOSIS — E66811 Obesity, class 1: Secondary | ICD-10-CM

## 2023-07-27 DIAGNOSIS — R7303 Prediabetes: Secondary | ICD-10-CM

## 2023-07-27 DIAGNOSIS — Z23 Encounter for immunization: Secondary | ICD-10-CM

## 2023-07-27 DIAGNOSIS — E785 Hyperlipidemia, unspecified: Secondary | ICD-10-CM

## 2023-07-27 LAB — COMPREHENSIVE METABOLIC PANEL
ALT: 15 U/L (ref 0–53)
AST: 19 U/L (ref 0–37)
Albumin: 4.6 g/dL (ref 3.5–5.2)
Alkaline Phosphatase: 70 U/L (ref 39–117)
BUN: 17 mg/dL (ref 6–23)
CO2: 30 meq/L (ref 19–32)
Calcium: 9.8 mg/dL (ref 8.4–10.5)
Chloride: 104 meq/L (ref 96–112)
Creatinine, Ser: 1.12 mg/dL (ref 0.40–1.50)
GFR: 78.41 mL/min (ref >60.00)
Glucose, Bld: 97 mg/dL (ref 70–99)
Potassium: 4 meq/L (ref 3.5–5.1)
Sodium: 140 meq/L (ref 135–145)
Total Bilirubin: 0.6 mg/dL (ref 0.2–1.2)
Total Protein: 7.1 g/dL (ref 6.0–8.3)

## 2023-07-27 LAB — MICROALBUMIN / CREATININE URINE RATIO
Creatinine,U: 198.1 mg/dL
Microalb Creat Ratio: 0.4 mg/g (ref 0.0–30.0)
Microalb, Ur: <0.7 mg/dL (ref 0.0–1.9)

## 2023-07-27 LAB — CBC WITH DIFFERENTIAL/PLATELET
Basophils Absolute: 0 10*3/uL (ref 0.0–0.1)
Basophils Relative: 0.5 % (ref 0.0–3.0)
Eosinophils Absolute: 0 10*3/uL (ref 0.0–0.7)
Eosinophils Relative: 0.3 % (ref 0.0–5.0)
HCT: 45 % (ref 39.0–52.0)
Hemoglobin: 15.1 g/dL (ref 13.0–17.0)
Lymphocytes Relative: 25.1 % (ref 12.0–46.0)
Lymphs Abs: 1.6 10*3/uL (ref 0.7–4.0)
MCHC: 33.5 g/dL (ref 30.0–36.0)
MCV: 86.5 fL (ref 78.0–100.0)
Monocytes Absolute: 0.6 10*3/uL (ref 0.1–1.0)
Monocytes Relative: 9.4 % (ref 3.0–12.0)
Neutro Abs: 4.1 10*3/uL (ref 1.4–7.7)
Neutrophils Relative %: 64.7 % (ref 43.0–77.0)
Platelets: 271 10*3/uL (ref 150.0–400.0)
RBC: 5.2 Mil/uL (ref 4.22–5.81)
RDW: 12.8 % (ref 11.5–15.5)
WBC: 6.4 10*3/uL (ref 4.0–10.5)

## 2023-07-27 LAB — LIPID PANEL
Cholesterol: 202 mg/dL — ABNORMAL HIGH (ref 0–200)
HDL: 42.4 mg/dL (ref >39.00)
LDL Cholesterol: 128 mg/dL — ABNORMAL HIGH (ref 0–99)
NonHDL: 159.68
Total CHOL/HDL Ratio: 5
Triglycerides: 157 mg/dL — ABNORMAL HIGH (ref 0.0–149.0)
VLDL: 31.4 mg/dL (ref 0.0–40.0)

## 2023-07-27 LAB — LDL CHOLESTEROL, DIRECT: Direct LDL: 130 mg/dL

## 2023-07-27 LAB — TSH: TSH: 1.47 u[IU]/mL (ref 0.35–5.50)

## 2023-07-27 LAB — HEMOGLOBIN A1C: Hgb A1c MFr Bld: 5.7 % (ref 4.6–6.5)

## 2023-07-27 NOTE — Assessment & Plan Note (Signed)

## 2023-07-27 NOTE — Progress Notes (Unsigned)
Patient ID: Jeremy Carpenter, male    DOB: [birth year]  Age: 47 y.o. MRN: [id number]  The patient is here for annual preventive examination and management of other chronic and acute problems.   The risk factors are reflected in the social history.   The roster of all physicians providing medical care to patient - is listed in the Snapshot section of the chart.   Activities of daily living:  The patient is 100% independent in all ADLs: dressing, toileting, feeding as well as independent mobility   Home safety : The patient has smoke detectors in the home. They wear seatbelts.  There are no unsecured firearms at home. There is no violence in the home.    There is no risks for hepatitis, STDs or HIV. There is no   history of blood transfusion. They have no travel history to infectious disease endemic areas of the world.   The patient has seen their dentist in the last six month. They have seen their eye doctor in the last year. The patinet  denies slight hearing difficulty with regard to whispered voices and some television programs.  They have deferred audiologic testing in the last year.  They do not  have excessive sun exposure. Discussed the need for sun protection: hats, long sleeves and use of sunscreen if there is significant sun exposure.    Diet: the importance of a healthy diet is discussed. They do have a healthy diet.   The benefits of regular aerobic exercise were discussed. The patient  exercises  3 to 5 days per week  for  60 minutes.    Depression screen: there are no signs or vegative symptoms of depression- irritability, change in appetite, anhedonia, sadness/tearfullness.   The following portions of the patient's history were reviewed and updated as appropriate: allergies, current medications, past family history, past medical history,  past surgical history, past social history  and problem list.   Visual acuity was not assessed per patient preference since the patient has regular  follow up with an  ophthalmologist. Hearing and body mass index were assessed and reviewed.    During the course of the visit the patient was educated and counseled about appropriate screening and preventive services including : fall prevention , diabetes screening, nutrition counseling, colorectal cancer screening, and recommended immunizations.    Chief Complaint:   1) He had a COVID infection 2 months ago .  Sense of smell has been persistently altered    Review of Symptoms  Patient denies headache, fevers, malaise, unintentional weight loss, skin rash, eye pain, sinus congestion and sinus pain, sore throat, dysphagia,  hemoptysis , cough, dyspnea, wheezing, chest pain, palpitations, orthopnea, edema, abdominal pain, nausea, melena, diarrhea, constipation, flank pain, dysuria, hematuria, urinary  Frequency, nocturia, numbness, tingling, seizures,  Focal weakness, Loss of consciousness,  Tremor, insomnia, depression, anxiety, and suicidal ideation.    Physical Exam:  BP 118/84   Pulse 77   Temp 98 F (36.7 C) (Oral)   Ht 5\' 10"  (1.778 m)   Wt 220 lb 6.4 oz (100 kg)   SpO2 96%   BMI 31.62 kg/m    Physical Exam Vitals reviewed.  Constitutional:      General: He is not in acute distress.    Appearance: Normal appearance. He is normal weight. He is not ill-appearing, toxic-appearing or diaphoretic.  HENT:     Head: Normocephalic and atraumatic.     Right Ear: Tympanic membrane, ear canal and external ear normal. There  is no impacted cerumen.     Left Ear: Tympanic membrane, ear canal and external ear normal. There is no impacted cerumen.     Nose: Nose normal.     Mouth/Throat:     Mouth: Mucous membranes are moist.     Pharynx: Oropharynx is clear.  Eyes:     General: No scleral icterus.       Right eye: No discharge.        Left eye: No discharge.     Conjunctiva/sclera: Conjunctivae normal.  Neck:     Thyroid: No thyromegaly.     Vascular: No carotid bruit or JVD.   Cardiovascular:     Rate and Rhythm: Normal rate and regular rhythm.     Heart sounds: Normal heart sounds.  Pulmonary:     Effort: Pulmonary effort is normal. No respiratory distress.     Breath sounds: Normal breath sounds.  Abdominal:     General: Bowel sounds are normal.     Palpations: Abdomen is soft. There is no mass.     Tenderness: There is no abdominal tenderness. There is no guarding or rebound.  Musculoskeletal:        General: Normal range of motion.     Cervical back: Normal range of motion and neck supple.  Lymphadenopathy:     Cervical: No cervical adenopathy.  Skin:    General: Skin is warm and dry.  Neurological:     General: No focal deficit present.     Mental Status: He is alert and oriented to person, place, and time. Mental status is at baseline.  Psychiatric:        Mood and Affect: Mood normal.        Behavior: Behavior normal.        Thought Content: Thought content normal.        Judgment: Judgment normal.    Assessment and Plan: Encounter for preventive health examination Assessment & Plan: age appropriate education and counseling updated, referrals for preventative services and immunizations addressed, dietary and smoking counseling addressed, most recent labs reviewed.  I have personally reviewed and have noted:   1) the patient's medical and social history 2) The pt's use of alcohol, tobacco, and illicit drugs 3) The patient's current medications and supplements 4) Functional ability including ADL's, fall risk, home safety risk, hearing and visual impairment 5) Diet and physical activities 6) Evidence for depression or mood disorder 7) The patient's height, weight, and BMI have been recorded in the chart  I have made referrals, and provided counseling and education based on review of the above    Obesity (BMI 30.0-34.9) Assessment & Plan: I have reviewed his  BMI and recommended a low glycemic index diet utilizing smaller more frequent  meals to increase metabolism.  I have also recommended that patient start exercising with a goal of 30 minutes of aerobic exercise a minimum of 5 days per week. Screening for lipid disorders, thyroid and diabetes to be done.  Lab Results  Component Value Date   TSH 2.17 02/23/2021   Lab Results  Component Value Date   HGBA1C 5.9 07/19/2022   Lab Results  Component Value Date   CHOL 199 07/19/2022   HDL 43.80 07/19/2022   LDLCALC 126 (H) 07/19/2022   LDLDIRECT 131.0 07/19/2022   TRIG 150.0 (H) 07/19/2022   CHOLHDL 5 07/19/2022       Orders: -     CBC with Differential/Platelet -     TSH  Hyperlipidemia,  unspecified hyperlipidemia type -     Lipid panel -     LDL cholesterol, direct  Prediabetes -     Comprehensive metabolic panel -     Hemoglobin A1c -     Microalbumin / creatinine urine ratio  Need for influenza vaccination -     Flu vaccine trivalent PF, 6mos and older(Flulaval,Afluria,Fluarix,Fluzone)    No follow-ups on file.  Sherlene Shams, MD

## 2023-07-27 NOTE — Assessment & Plan Note (Signed)
I have reviewed his  BMI and recommended a low glycemic index diet utilizing smaller more frequent meals to increase metabolism.  I have also recommended that patient start exercising with a goal of 30 minutes of aerobic exercise a minimum of 5 days per week. Screening for lipid disorders, thyroid and diabetes to be done.  Lab Results  Component Value Date   TSH 2.17 02/23/2021   Lab Results  Component Value Date   HGBA1C 5.9 07/19/2022   Lab Results  Component Value Date   CHOL 199 07/19/2022   HDL 43.80 07/19/2022   LDLCALC 126 (H) 07/19/2022   LDLDIRECT 131.0 07/19/2022   TRIG 150.0 (H) 07/19/2022   CHOLHDL 5 07/19/2022

## 2023-07-29 ENCOUNTER — Other Ambulatory Visit: Payer: Self-pay | Admitting: Internal Medicine

## 2024-05-01 NOTE — Progress Notes (Unsigned)
 [facility] [address] [place] [address] Phone: [phone] Subjective:   ISusannah Carpenter, am serving as a scribe for Dr. Arthea Claudene.  I'm seeing this patient by the request  of:  Jeremy Verneita CROME, MD CC: Left shoulder pain  YEP:Dlagzrupcz  HENDRIK DONATH is a 48 y.o. male coming in with complaint of L shoulder pain. Patient states started slow and pain was incremented, but now constant and with pressure a certain movements cause the pain to me intense. Has only done ibuprofen  and doesn't really help. Pain feels like its up into armpit. No clicking or popping association with pain is short. Grip strength maybe a bit diminished.   Xray L shoulder 2022 IMPRESSION: Mild acromioclavicular osteoarthritis. Tiny subacromial spur.  Reviewing previous notes have had this pain intermittently for quite some time.      Past Medical History:  Diagnosis Date   COVID-19 05/24/2023   Visit for preventive health examination 01/15/2015   No past surgical history on file. Social History   Socioeconomic History   Marital status: Married    Spouse name: Not on file   Number of children: Not on file   Years of education: Not on file   Highest education level: 12th grade  Occupational History   Not on file  Tobacco Use   Smoking status: Former    Current packs/day: 0.00    Average packs/day: 1 pack/day for 4.0 years (4.0 ttl pk-yrs)    Types: Cigarettes    Start date: 01/15/1996    Quit date: 01/15/2000    Years since quitting: 24.3   Smokeless tobacco: Never  Substance and Sexual Activity   Alcohol use: Yes    Alcohol/week: 0.0 standard drinks of alcohol   Drug use: No   Sexual activity: Yes  Other Topics Concern   Not on file  Social History Narrative   Not on file   Social Drivers of Health   Financial Resource Strain: Low Risk  (07/26/2023)   Overall Financial Resource Strain (CARDIA)    Difficulty of Paying Living Expenses: Not very  hard  Food Insecurity: No Food Insecurity (07/26/2023)   Hunger Vital Sign    Worried About Running Out of Food in the Last Year: Never true    Ran Out of Food in the Last Year: Never true  Transportation Needs: No Transportation Needs (07/26/2023)   PRAPARE - Administrator, Civil Service (Medical): No    Lack of Transportation (Non-Medical): No  Physical Activity: Insufficiently Active (07/26/2023)   Exercise Vital Sign    Days of Exercise per Week: 2 days    Minutes of Exercise per Session: 20 min  Stress: No Stress Concern Present (07/26/2023)   [employer] of Occupational Health - Occupational Stress Questionnaire    Feeling of Stress : Not at all  Social Connections: Moderately Isolated (07/26/2023)   Social Connection and Isolation Panel    Frequency of Communication with Friends and Family: Once a week    Frequency of Social Gatherings with Friends and Family: Once a week    Attends Religious Services: 1 to 4 times per year    Active Member of [employer] or Organizations: No    Attends Engineer, structural: Not on file    Marital Status: Married   No Known Allergies Family History  Problem Relation Age of Onset   Cancer Maternal Grandmother    No current outpatient medications on file.  Reviewed prior external information including notes and imaging from  primary care provider As well as notes that were available from care everywhere and other healthcare systems.  Past medical history, social, surgical and family history all reviewed in electronic medical record.  No pertanent information unless stated regarding to the chief complaint.   Review of Systems:  No headache, visual changes, nausea, vomiting, diarrhea, constipation, dizziness, abdominal pain, skin rash, fevers, chills, night sweats, weight loss, swollen lymph nodes, body aches, joint swelling, chest pain, shortness of breath, mood changes. POSITIVE muscle aches  Objective  Blood  pressure 110/78, pulse 65, height 5' 10 (1.778 m), SpO2 98%.   General: No apparent distress alert and oriented x3 mood and affect normal, dressed appropriately.  HEENT: Pupils equal, extraocular movements intact  Respiratory: Patient's speak in full sentences and does not appear short of breath  Cardiovascular: No lower extremity edema, non tender, no erythema   Left shoulder has severe positive crossover.  Rotator cuff strength is intact.  Negative O'Brien's noted.  Tender to palpation over the acromioclavicular joint with audible popping with range of motion.  Limited muscular skeletal ultrasound was performed and interpreted by CLAUDENE HUSSAR, M   Limited ultrasound shows patient does have a hypoechoic changes noted.  Seems to have systemic significant hyper calcific changes noted to the acromioclavicular joint that is consistent with severe spurring noted.  Hypoechoic changes consistent with effusion is also noted. Impression: Acromioclavicular arthritis  Procedure: Real-time Ultrasound Guided Injection of left acromioclavicular joint Device: GE Logiq Q7 Ultrasound guided injection is preferred based studies that show increased duration, increased effect, greater accuracy, decreased procedural pain, increased response rate, and decreased cost with ultrasound guided versus blind injection.  Verbal informed consent obtained.  Time-out conducted.  Noted no overlying erythema, induration, or other signs of local infection.  Skin prepped in a sterile fashion.  Local anesthesia: Topical Ethyl chloride.  With sterile technique and under real time ultrasound guidance: With a 25-gauge half inch needle injected with 0.5 cc of 0.5% Marcaine and 0.5 cc Kenalog  40 mg/mL Completed without difficulty  Pain immediately resolved suggesting accurate placement of the medication.  Advised to call if fevers/chills, erythema, induration, drainage, or persistent bleeding.  Impression: Technically successful  ultrasound guided injection.  [id number]; 15 additional minutes spent for Therapeutic exercises as stated in above notes.  This included exercises focusing on stretching, strengthening, with significant focus on eccentric aspects.   Long term goals include an improvement in range of motion, strength, endurance as well as avoiding reinjury. Patient's frequency would include in 1-2 times a day, 3-5 times a week for a duration of 6-12 weeks.  Shoulder Exercises that included:  Basic scapular stabilization to include adduction and depression of scapula Scaption, focusing on proper movement and good control Internal and External rotation utilizing a theraband, with elbow tucked at side entire time Rows with theraband  Proper technique shown and discussed handout in great detail with ATC.  All questions were discussed and answered.     Impression and Recommendations:    The above documentation has been reviewed and is accurate and complete Danetta Prom M Stephene Alegria, DO

## 2024-05-02 ENCOUNTER — Ambulatory Visit (INDEPENDENT_AMBULATORY_CARE_PROVIDER_SITE_OTHER)

## 2024-05-02 ENCOUNTER — Other Ambulatory Visit: Payer: Self-pay

## 2024-05-02 ENCOUNTER — Ambulatory Visit: Payer: Self-pay | Admitting: Family Medicine

## 2024-05-02 ENCOUNTER — Ambulatory Visit: Admitting: Family Medicine

## 2024-05-02 ENCOUNTER — Encounter: Payer: Self-pay | Admitting: Family Medicine

## 2024-05-02 VITALS — BP 110/78 | HR 65 | Ht 70.0 in

## 2024-05-02 DIAGNOSIS — M19012 Primary osteoarthritis, left shoulder: Secondary | ICD-10-CM | POA: Diagnosis not present

## 2024-05-02 DIAGNOSIS — M25512 Pain in left shoulder: Secondary | ICD-10-CM

## 2024-05-02 LAB — COMPREHENSIVE METABOLIC PANEL WITH GFR
ALT: 17 U/L (ref 0–53)
AST: 17 U/L (ref 0–37)
Albumin: 4.3 g/dL (ref 3.5–5.2)
Alkaline Phosphatase: 69 U/L (ref 39–117)
BUN: 14 mg/dL (ref 6–23)
CO2: 29 meq/L (ref 19–32)
Calcium: 9.2 mg/dL (ref 8.4–10.5)
Chloride: 105 meq/L (ref 96–112)
Creatinine, Ser: 1.04 mg/dL (ref 0.40–1.50)
GFR: 85.24 mL/min (ref >60.00)
Glucose, Bld: 100 mg/dL — ABNORMAL HIGH (ref 70–99)
Potassium: 4.2 meq/L (ref 3.5–5.1)
Sodium: 140 meq/L (ref 135–145)
Total Bilirubin: 0.8 mg/dL (ref 0.2–1.2)
Total Protein: 6.9 g/dL (ref 6.0–8.3)

## 2024-05-02 LAB — VITAMIN D 25 HYDROXY (VIT D DEFICIENCY, FRACTURES): VITD: 29.64 ng/mL — ABNORMAL LOW (ref 30.00–100.00)

## 2024-05-02 LAB — TESTOSTERONE: Testosterone: 379.39 ng/dL (ref 300.00–890.00)

## 2024-05-02 LAB — URIC ACID: Uric Acid, Serum: 6.9 mg/dL (ref 4.0–7.8)

## 2024-05-02 LAB — VITAMIN B12: Vitamin B-12: 221 pg/mL (ref 211–911)

## 2024-05-02 LAB — SEDIMENTATION RATE: Sed Rate: 5 mm/h (ref 0–15)

## 2024-05-02 LAB — TSH: TSH: 1.56 u[IU]/mL (ref 0.35–5.50)

## 2024-05-02 NOTE — Assessment & Plan Note (Signed)
 Patient given injection today and tolerated the procedure well, discussed icing regimen and home exercises, discussed which activities to do and which ones to avoid.  Follow-up with me again in 3 months.  Large spurring occurring that could need possible shockwave therapy or surgical intervention.  May need to even consider advanced imaging if he does not respond.

## 2024-05-02 NOTE — Patient Instructions (Addendum)
 Labs today Injection today See you again in 6 weeks Bagner and Alexa and Scramble

## 2024-05-03 LAB — PTH, INTACT AND CALCIUM
Calcium: 9.3 mg/dL (ref 8.6–10.3)
PTH: 29 pg/mL (ref 16–77)

## 2024-06-12 NOTE — Progress Notes (Unsigned)
 [facility] [address] [place] [address] Phone: [phone] Subjective:   Jeremy Carpenter, am serving as a scribe for Dr. Arthea Claudene.  I'm seeing this patient by the request  of:  [name], MD  CC: Left shoulder pain  YEP:Dlagzrupcz  05/02/2024 Patient given injection today and tolerated the procedure well, discussed icing regimen and home exercises, discussed which activities to do and which ones to avoid.  Follow-up with me again in 3 months.  Large spurring occurring that could need possible shockwave therapy or surgical intervention.  May need to even consider advanced imaging if he does not respond.      Update 06/13/2024 Jeremy Carpenter is a 48 y.o. male coming in with complaint of L shoulder pain. Patient states that his pain improved for 1 week after injection. Pain is fairly constant. Pain with adduction, extension, abduction and flexion.       Past Medical History:  Diagnosis Date   COVID-19 05/24/2023   Visit for preventive health examination 01/15/2015   No past surgical history on file. Social History   Socioeconomic History   Marital status: Married    Spouse name: Not on file   Number of children: Not on file   Years of education: Not on file   Highest education level: 12th grade  Occupational History   Not on file  Tobacco Use   Smoking status: Former    Current packs/day: 0.00    Average packs/day: 1 pack/day for 4.0 years (4.0 ttl pk-yrs)    Types: Cigarettes    Start date: 01/15/1996    Quit date: 01/15/2000    Years since quitting: 24.4   Smokeless tobacco: Never  Substance and Sexual Activity   Alcohol use: Yes    Alcohol/week: 0.0 standard drinks of alcohol   Drug use: No   Sexual activity: Yes  Other Topics Concern   Not on file  Social History Narrative   Not on file   Social Drivers of Health   Financial Resource Strain: Low Risk  (07/26/2023)   Overall Financial Resource Strain (CARDIA)     Difficulty of Paying Living Expenses: Not very hard  Food Insecurity: No Food Insecurity (07/26/2023)   Hunger Vital Sign    Worried About Running Out of Food in the Last Year: Never true    Ran Out of Food in the Last Year: Never true  Transportation Needs: No Transportation Needs (07/26/2023)   PRAPARE - Administrator, Civil Service (Medical): No    Lack of Transportation (Non-Medical): No  Physical Activity: Insufficiently Active (07/26/2023)   Exercise Vital Sign    Days of Exercise per Week: 2 days    Minutes of Exercise per Session: 20 min  Stress: No Stress Concern Present (07/26/2023)   [employer] of Occupational Health - Occupational Stress Questionnaire    Feeling of Stress : Not at all  Social Connections: Moderately Isolated (07/26/2023)   Social Connection and Isolation Panel    Frequency of Communication with Friends and Family: Once a week    Frequency of Social Gatherings with Friends and Family: Once a week    Attends Religious Services: 1 to 4 times per year    Active Member of [employer] or Organizations: No    Attends Engineer, structural: Not on file    Marital Status: Married   No Known Allergies Family History  Problem Relation Age of Onset   Cancer Maternal  Grandmother    No current outpatient medications on file.   Reviewed prior external information including notes and imaging from  primary care provider As well as notes that were available from care everywhere and other healthcare systems.  Past medical history, social, surgical and family history all reviewed in electronic medical record.  No pertanent information unless stated regarding to the chief complaint.   Review of Systems:  No headache, visual changes, nausea, vomiting, diarrhea, constipation, dizziness, abdominal pain, skin rash, fevers, chills, night sweats, weight loss, swollen lymph nodes, body aches, joint swelling, chest pain, shortness of breath, mood  changes. POSITIVE muscle aches  Objective  Blood pressure 120/82, pulse 60, height 5' 10 (1.778 m), weight 226 lb (102.5 kg), SpO2 97%.   General: No apparent distress alert and oriented x3 mood and affect normal, dressed appropriately.  HEENT: Pupils equal, extraocular movements intact  Respiratory: Patient's speak in full sentences and does not appear short of breath  Cardiovascular: No lower extremity edema, non tender, no erythema  Left shoulder still shows that patient does have positive impingement noted with the shoulder.  Not having some difficulty reaching overhead.  No significant weakness noted.   Limited muscular skeletal ultrasound was performed and interpreted by CLAUDENE HUSSAR, M  Limited ultrasound shows significant decrease in the hypoechoic changes over the acromioclavicular joint from previous exam but still significant arthritic changes noted. Increasing the hypoechoic changes noted of the tendon sheath both of the supraspinatus and the subscapularis but no true tearing appreciated.  Procedure: Real-time Ultrasound Guided Injection of left glenohumeral joint Device: GE Logiq E  Ultrasound guided injection is preferred based studies that show increased duration, increased effect, greater accuracy, decreased procedural pain, increased response rate with ultrasound guided versus blind injection.  Verbal informed consent obtained.  Time-out conducted.  Noted no overlying erythema, induration, or other signs of local infection.  Skin prepped in a sterile fashion.  Local anesthesia: Topical Ethyl chloride.  With sterile technique and under real time ultrasound guidance:  Joint visualized.  21g 2 inch needle inserted posterior approach. Pictures taken for needle placement. Patient did have injection of 2 cc of 0.5% Marcaine, and 1cc of Kenalog  40 mg/dL. Completed without difficulty  Pain immediately resolved suggesting accurate placement of the medication.  Advised to call if  fevers/chills, erythema, induration, drainage, or persistent bleeding.  Images permanently stored  Impression: Technically successful ultrasound guided injection.   Impression and Recommendations:    The above documentation has been reviewed and is accurate and complete Dmitri Pettigrew M Vikkie Goeden, DO

## 2024-06-13 ENCOUNTER — Other Ambulatory Visit: Payer: Self-pay

## 2024-06-13 ENCOUNTER — Ambulatory Visit (INDEPENDENT_AMBULATORY_CARE_PROVIDER_SITE_OTHER): Admitting: Family Medicine

## 2024-06-13 ENCOUNTER — Encounter: Payer: Self-pay | Admitting: Family Medicine

## 2024-06-13 VITALS — BP 120/82 | HR 60 | Ht 70.0 in | Wt 226.0 lb

## 2024-06-13 DIAGNOSIS — M25512 Pain in left shoulder: Secondary | ICD-10-CM | POA: Diagnosis not present

## 2024-06-13 DIAGNOSIS — M7582 Other shoulder lesions, left shoulder: Secondary | ICD-10-CM

## 2024-06-13 DIAGNOSIS — G8929 Other chronic pain: Secondary | ICD-10-CM

## 2024-06-13 NOTE — Patient Instructions (Addendum)
 Injection in shoulder Send message in 2 weeks if not better will get MRI  See me in 2 months

## 2024-06-13 NOTE — Assessment & Plan Note (Signed)
 Patient given injection and tolerated the procedure well, discussed icing regimen and home exercises, discussed with patient though due to the severity of the arthritic changes in the [facility] joint I would consider the possibility of advanced imaging if patient does not make significant improvement with this injection either.  Would need to look for more intra-articular pathology that could be contributing.  Follow-up again in 6 to 8 weeks

## 2024-07-07 ENCOUNTER — Encounter: Payer: Self-pay | Admitting: Family Medicine

## 2024-07-08 ENCOUNTER — Other Ambulatory Visit: Payer: Self-pay

## 2024-07-08 DIAGNOSIS — G8929 Other chronic pain: Secondary | ICD-10-CM

## 2024-07-18 ENCOUNTER — Ambulatory Visit
Admission: RE | Admit: 2024-07-18 | Discharge: 2024-07-18 | Disposition: A | Discharge status: Home / Self Care | Source: Ambulatory Visit | Attending: Family Medicine | Admitting: Family Medicine

## 2024-07-18 ENCOUNTER — Other Ambulatory Visit: Payer: Self-pay | Admitting: Family Medicine

## 2024-07-18 DIAGNOSIS — T1590XA Foreign body on external eye, part unspecified, unspecified eye, initial encounter: Secondary | ICD-10-CM

## 2024-07-18 DIAGNOSIS — G8929 Other chronic pain: Secondary | ICD-10-CM | POA: Diagnosis not present

## 2024-07-18 DIAGNOSIS — M25512 Pain in left shoulder: Secondary | ICD-10-CM | POA: Insufficient documentation

## 2024-07-18 DIAGNOSIS — M7582 Other shoulder lesions, left shoulder: Secondary | ICD-10-CM | POA: Diagnosis not present

## 2024-07-18 DIAGNOSIS — Z135 Encounter for screening for eye and ear disorders: Secondary | ICD-10-CM | POA: Diagnosis not present

## 2024-07-21 ENCOUNTER — Ambulatory Visit: Payer: Self-pay | Admitting: Family Medicine

## 2024-07-28 ENCOUNTER — Other Ambulatory Visit: Payer: Self-pay

## 2024-07-28 ENCOUNTER — Ambulatory Visit: Payer: Self-pay | Admitting: Family Medicine

## 2024-07-28 VITALS — BP 114/82 | HR 102 | Ht 70.0 in | Wt 227.0 lb

## 2024-07-28 DIAGNOSIS — M7582 Other shoulder lesions, left shoulder: Secondary | ICD-10-CM

## 2024-07-28 DIAGNOSIS — M25512 Pain in left shoulder: Secondary | ICD-10-CM

## 2024-07-28 NOTE — Assessment & Plan Note (Signed)
 Injection given today.  Post PRP instructions given.

## 2024-07-28 NOTE — Progress Notes (Signed)
  [facility] [address] [place] [address] Phone: [phone] Subjective:    I'm seeing this patient by the request  of:  [name], MD  CC: Left shoulder pain follow-up  YEP:Dlagzrupcz  Jeremy Carpenter is a 48 y.o. male coming in with complaint of L shoulder pain. Here for PRP. Patient states that he is better than his first visit. Pain is constant but not as intense.  MRI did show some interstitial tearing noted of the supraspinatus and the subscapularis but no significant retraction       Past Medical History:  Diagnosis Date   COVID-19 05/24/2023   Visit for preventive health examination 01/15/2015   No past surgical history on file.  Objective  Blood pressure 114/82, pulse (!) 102, height 5' 10 (1.778 m), weight 227 lb (103 kg), SpO2 99%.   General: No apparent distress alert and oriented x3 mood and affect normal, dressed appropriately.   Procedure: Real-time Ultrasound Guided Injection of left supraspinatus and subscapularis tendon sheath Device: GE Logiq E  Ultrasound guided injection is preferred based studies that show increased duration, increased effect, greater accuracy, decreased procedural pain, increased response rate with ultrasound guided versus blind injection.  Verbal informed consent obtained.  Time-out conducted.  Noted no overlying erythema, induration, or other signs of local infection.  Skin prepped in a sterile fashion.  Local anesthesia: Topical Ethyl chloride.  With sterile technique and under real time ultrasound guidance:  Joint visualized.  21g 2 inch needle inserted lateral approach. Pictures taken for needle placement. Patient did have injection of 2 cc of 0.5% Marcaine, and then injected 5 cc of PRP leukocyte rich mixture Pain immediately resolved suggesting accurate placement of the medication.  Advised to call if fevers/chills, erythema, induration, drainage, or persistent bleeding.  Images  permanently stored   Impression: Technically successful ultrasound guided injection.    Impression and Recommendations:     The above documentation has been reviewed and is accurate and complete Khai Torbert M Lola Czerwonka, DO

## 2024-07-28 NOTE — Patient Instructions (Signed)
No ice or IBU for 3 days Heat and Tylenol are ok See me again in 8 weeks 

## 2024-07-30 ENCOUNTER — Encounter: Payer: Self-pay | Admitting: Internal Medicine

## 2024-07-30 ENCOUNTER — Ambulatory Visit: Admitting: Internal Medicine

## 2024-07-30 VITALS — BP 110/74 | HR 84 | Ht 70.0 in | Wt 219.6 lb

## 2024-07-30 DIAGNOSIS — Z23 Encounter for immunization: Secondary | ICD-10-CM | POA: Diagnosis not present

## 2024-07-30 DIAGNOSIS — R430 Anosmia: Secondary | ICD-10-CM | POA: Diagnosis not present

## 2024-07-30 DIAGNOSIS — E66811 Obesity, class 1: Secondary | ICD-10-CM | POA: Diagnosis not present

## 2024-07-30 DIAGNOSIS — Z0001 Encounter for general adult medical examination with abnormal findings: Secondary | ICD-10-CM | POA: Diagnosis not present

## 2024-07-30 DIAGNOSIS — E559 Vitamin D deficiency, unspecified: Secondary | ICD-10-CM

## 2024-07-30 DIAGNOSIS — Z113 Encounter for screening for infections with a predominantly sexual mode of transmission: Secondary | ICD-10-CM | POA: Diagnosis not present

## 2024-07-30 DIAGNOSIS — E538 Deficiency of other specified B group vitamins: Secondary | ICD-10-CM | POA: Diagnosis not present

## 2024-07-30 DIAGNOSIS — J3489 Other specified disorders of nose and nasal sinuses: Secondary | ICD-10-CM

## 2024-07-30 NOTE — Patient Instructions (Addendum)
  You can take 3000 mg of acetominophen (tylenol) every day safely  In divided doses (750 mg  every 6 hours  Or 1000 mg every 8 hours.)   Your loss of taste and smell may be due sinus congestion .  I recommend a trial of Flonase  DAILY use   Your b12 level should be repeated at some point before the end of the year.  I have ordered the labs

## 2024-07-30 NOTE — Progress Notes (Unsigned)
 Patient ID: Jeremy Carpenter, male    DOB: Dec 09, 1975  Age: 48 y.o. MRN: [id number]  The patient is here for annual preventive examination and management of other chronic and acute problems.   The risk factors are reflected in the social history.   The roster of all physicians providing medical care to patient - is listed in the Snapshot section of the chart.   Activities of daily living:  The patient is 100% independent in all ADLs: dressing, toileting, feeding as well as independent mobility   Home safety : The patient has smoke detectors in the home. They wear seatbelts.  There are no unsecured firearms at home. There is no violence in the home.    There is no risks for hepatitis, STDs or HIV. There is no   history of blood transfusion. They have no travel history to infectious disease endemic areas of the world.   The patient has seen their dentist in the last six month. They have seen their eye doctor in the last year. The patinet  denies slight hearing difficulty with regard to whispered voices and some television programs.  They have deferred audiologic testing in the last year.  They do not  have excessive sun exposure. Discussed the need for sun protection: hats, long sleeves and use of sunscreen if there is significant sun exposure.    Diet: the importance of a healthy diet is discussed. They do have a healthy diet.   The benefits of regular aerobic exercise were discussed. The patient  exercises  3 to 5 days per week  for  60 minutes.    Depression screen: there are no signs or vegative symptoms of depression- irritability, change in appetite, anhedonia, sadness/tearfullness.   The following portions of the patient's history were reviewed and updated as appropriate: allergies, current medications, past family history, past medical history,  past surgical history, past social history  and problem list.   Visual acuity was not assessed per patient preference since the patient has regular  follow up with an  ophthalmologist. Hearing and body mass index were assessed and reviewed.    During the course of the visit the patient was educated and counseled about appropriate screening and preventive services including : fall prevention , diabetes screening, nutrition counseling, colorectal cancer screening, and recommended immunizations.    Chief Complaint:  B12 deficiency  noted in August ;. Using  gummy bear supplements  Hypercalcemia workup negative.   Taking tart cherry rec by Dr Claudene for uric acid >6; no histor yof gout.   Losing weight intentionally Shoulder pain at night when sleeping had plasm injection  on Monday  Intermittent episodes of anosmia.epsiodes last anywhere from 1 week to 2 months.  Can only taste sweet and sometimes spice.  Has been occurring since early 20's .  No headaches .  Saw ENT years ago but never went back   has chronic sinus congestion  uses flonase  intermittently      Review of Symptoms  Patient denies headache, fevers, malaise, unintentional weight loss, skin rash, eye pain, sinus congestion and sinus pain, sore throat, dysphagia,  hemoptysis , cough, dyspnea, wheezing, chest pain, palpitations, orthopnea, edema, abdominal pain, nausea, melena, diarrhea, constipation, flank pain, dysuria, hematuria, urinary  Frequency, nocturia, numbness, tingling, seizures,  Focal weakness, Loss of consciousness,  Tremor, insomnia, depression, anxiety, and suicidal ideation.    Physical Exam:  BP 110/74   Pulse 84   Ht 5' 10 (1.778 m)   Wt 219  lb 9.6 oz (99.6 kg)   SpO2 96%   BMI 31.51 kg/m    Physical Exam Vitals reviewed.  Constitutional:      General: He is not in acute distress.    Appearance: Normal appearance. He is normal weight. He is not ill-appearing, toxic-appearing or diaphoretic.  HENT:     Head: Normocephalic and atraumatic.     Right Ear: Tympanic membrane, ear canal and external ear normal. There is no impacted cerumen.     Left Ear:  Tympanic membrane, ear canal and external ear normal. There is no impacted cerumen.     Nose: Nose normal.     Mouth/Throat:     Mouth: Mucous membranes are moist.     Pharynx: Oropharynx is clear.  Eyes:     General: No scleral icterus.       Right eye: No discharge.        Left eye: No discharge.     Conjunctiva/sclera: Conjunctivae normal.  Neck:     Thyroid : No thyromegaly.     Vascular: No carotid bruit or JVD.  Cardiovascular:     Rate and Rhythm: Normal rate and regular rhythm.     Heart sounds: Normal heart sounds.  Pulmonary:     Effort: Pulmonary effort is normal. No respiratory distress.     Breath sounds: Normal breath sounds.  Abdominal:     General: Bowel sounds are normal.     Palpations: Abdomen is soft. There is no mass.     Tenderness: There is no abdominal tenderness. There is no guarding or rebound.  Musculoskeletal:        General: Normal range of motion.     Cervical back: Normal range of motion and neck supple.  Lymphadenopathy:     Cervical: No cervical adenopathy.  Skin:    General: Skin is warm and dry.  Neurological:     General: No focal deficit present.     Mental Status: He is alert and oriented to person, place, and time. Mental status is at baseline.  Psychiatric:        Mood and Affect: Mood normal.        Behavior: Behavior normal.        Thought Content: Thought content normal.        Judgment: Judgment normal.     Assessment and Plan: Screen for STD (sexually transmitted disease) -     HIV Antibody (routine testing w rflx); Future  B12 deficiency -     B12 and Folate Panel; Future -     Intrinsic Factor Antibodies; Future  Vitamin D  deficiency -     VITAMIN D  25 Hydroxy (Vit-D Deficiency, Fractures); Future  Anosmia due to nasal mucosa problem Assessment & Plan: Presumed etiology given concurrent sinus congestion and intermittent occurrence for the last 20 years.  Rec daily use of flonase     Need for influenza vaccination -      Flu vaccine trivalent PF, 6mos and older(Flulaval,Afluria,Fluarix,Fluzone)  Obesity (BMI 30.0-34.9) Assessment & Plan: He is losign weight by following a low glycemic index diet  and exercising .Screening for lipid disorders, thyroid  and diabetes to be done.  Lab Results  Component Value Date   TSH 1.56 05/02/2024   Lab Results  Component Value Date   HGBA1C 5.7 07/27/2023   Lab Results  Component Value Date   CHOL 202 (H) 07/27/2023   HDL 42.40 07/27/2023   LDLCALC 128 (H) 07/27/2023   LDLDIRECT 130.0 07/27/2023  TRIG 157.0 (H) 07/27/2023   CHOLHDL 5 07/27/2023        Encounter for general adult medical examination with abnormal findings Assessment & Plan: age appropriate education and counseling updated, referrals for preventative services and immunizations addressed, dietary and smoking counseling addressed, most recent labs reviewed.  I have personally reviewed and have noted:   1) the patient's medical and social history 2) The pt's use of alcohol, tobacco, and illicit drugs 3) The patient's current medications and supplements 4) Functional ability including ADL's, fall risk, home safety risk, hearing and visual impairment 5) Diet and physical activities 6) Evidence for depression or mood disorder 7) The patient's height, weight, and BMI have been recorded in the chart 8) I have ordered and reviewed a 12 lead EKG and find that there are no acute changes and patient is in sinus rhythm.     I have made referrals, and provided counseling and education based on review of the above      No follow-ups on file.  Verneita LITTIE Kettering, MD

## 2024-07-30 NOTE — Assessment & Plan Note (Signed)
 Presumed etiology given concurrent sinus congestion and intermittent occurrence for the last 20 years.  Rec daily use of flonase 

## 2024-07-31 NOTE — Assessment & Plan Note (Signed)
 He is losign weight by following a low glycemic index diet  and exercising .Screening for lipid disorders, thyroid  and diabetes to be done.  Lab Results  Component Value Date   TSH 1.56 05/02/2024   Lab Results  Component Value Date   HGBA1C 5.7 07/27/2023   Lab Results  Component Value Date   CHOL 202 (H) 07/27/2023   HDL 42.40 07/27/2023   LDLCALC 128 (H) 07/27/2023   LDLDIRECT 130.0 07/27/2023   TRIG 157.0 (H) 07/27/2023   CHOLHDL 5 07/27/2023

## 2024-07-31 NOTE — Assessment & Plan Note (Signed)

## 2024-08-01 ENCOUNTER — Telehealth: Payer: Self-pay | Admitting: Internal Medicine

## 2024-08-01 NOTE — Telephone Encounter (Signed)
 Pt dropped off [facility] paper to be filled out. It's in Dr Lula color folder up front

## 2024-08-04 NOTE — Telephone Encounter (Signed)
Placed in quick sign folder.  

## 2024-08-06 ENCOUNTER — Telehealth: Payer: Self-pay

## 2024-08-06 NOTE — Telephone Encounter (Signed)
 noted

## 2024-08-06 NOTE — Telephone Encounter (Signed)
 LMTCB. Need to let pt know that form is complete and ready for pick up.  Placed up front in the accordion folder.

## 2024-08-06 NOTE — Telephone Encounter (Signed)
 Copied from CRM #[id number]. Topic: General - Other >> Aug 06, 2024  4:05 PM Rosina BIRCH wrote: Reason for CRM: patient returning a call and I did read the message to him and he stated he may have his wife pick up the form

## 2024-08-06 NOTE — Telephone Encounter (Signed)
 Form has been picked up.

## 2024-08-11 ENCOUNTER — Encounter: Payer: Self-pay | Admitting: Family Medicine

## 2024-08-22 ENCOUNTER — Ambulatory Visit: Admitting: Family Medicine

## 2024-10-02 NOTE — Progress Notes (Signed)
 " [facility] [address] [place] [address] Phone: [phone] Subjective:   Jeremy Carpenter, am serving as a scribe for Dr. Arthea Carpenter.  I'm seeing this patient by the request  of:  Jeremy Verneita CROME, MD  CC: Left shoulder pain  YEP:Dlagzrupcz  07/28/2024 Injection given today.  Post PRP instructions given.      Update 08/03/2025 Jeremy Carpenter is a 49 y.o. male coming in with complaint of L shoulder pain. Patient states that his pain is now more on the top of shoulder where it was in axilla. Painful to IR, adduct and flex shoulder.     Past Medical History:  Diagnosis Date   COVID-19 05/24/2023   Visit for preventive health examination 01/15/2015   No past surgical history on file. Social History   Socioeconomic History   Marital status: Married    Spouse name: Not on file   Number of children: Not on file   Years of education: Not on file   Highest education level: 12th grade  Occupational History   Not on file  Tobacco Use   Smoking status: Former    Current packs/day: 0.00    Average packs/day: 1 pack/day for 4.0 years (4.0 ttl pk-yrs)    Types: Cigarettes    Start date: 01/15/1996    Quit date: 01/15/2000    Years since quitting: 24.7   Smokeless tobacco: Never  Substance and Sexual Activity   Alcohol use: Yes    Alcohol/week: 2.0 standard drinks of alcohol    Types: 2 Cans of beer per week   Drug use: No   Sexual activity: Yes  Other Topics Concern   Not on file  Social History Narrative   Not on file   Social Drivers of Health   Tobacco Use: Medium Risk (07/30/2024)   Patient History    Smoking Tobacco Use: Former    Smokeless Tobacco Use: Never    Passive Exposure: Not on file  Financial Resource Strain: Low Risk (07/27/2024)   Overall Financial Resource Strain (CARDIA)    Difficulty of Paying Living Expenses: Not very hard  Food Insecurity: No Food Insecurity (07/27/2024)   Epic    Worried About Community Education Officer in the Last Year: Never true    Ran Out of Food in the Last Year: Never true  Transportation Needs: No Transportation Needs (07/27/2024)   Epic    Lack of Transportation (Medical): No    Lack of Transportation (Non-Medical): No  Physical Activity: Inactive (07/27/2024)   Exercise Vital Sign    Days of Exercise per Week: 0 days    Minutes of Exercise per Session: Not on file  Stress: No Stress Concern Present (07/27/2024)   [employer] of Occupational Health - Occupational Stress Questionnaire    Feeling of Stress: Not at all  Social Connections: Moderately Integrated (07/27/2024)   Social Connection and Isolation Panel    Frequency of Communication with Friends and Family: Once a week    Frequency of Social Gatherings with Friends and Family: Once a week    Attends Religious Services: 1 to 4 times per year    Active Member of Clubs or Organizations: Yes    Attends Banker Meetings: 1 to 4 times per year    Marital Status: Married  Depression (PHQ2-9): Low Risk (07/30/2024)   Depression (PHQ2-9)    PHQ-2 Score: 0  Alcohol Screen: Low Risk (07/27/2024)   Alcohol Screen  Last Alcohol Screening Score (AUDIT): 2  Housing: Low Risk (07/27/2024)   Epic    Unable to Pay for Housing in the Last Year: No    Number of Times Moved in the Last Year: 0    Homeless in the Last Year: No  Utilities: Not on file  Health Literacy: Not on file   Allergies[1] Family History  Problem Relation Age of Onset   Cancer Maternal Grandmother    No current outpatient medications on file.   Reviewed prior external information including notes and imaging from  primary care provider As well as notes that were available from care everywhere and other healthcare systems.  Past medical history, social, surgical and family history all reviewed in electronic medical record.  No pertanent information unless stated regarding to the chief complaint.   Review of Systems:  No  headache, visual changes, nausea, vomiting, diarrhea, constipation, dizziness, abdominal pain, skin rash, fevers, chills, night sweats, weight loss, swollen lymph nodes, body aches, joint swelling, chest pain, shortness of breath, mood changes. POSITIVE muscle aches  Objective  Blood pressure 108/66, pulse 68, height 5' 10 (1.778 m), weight 226 lb (102.5 kg), SpO2 98%.   General: No apparent distress alert and oriented x3 mood and affect normal, dressed appropriately.  HEENT: Pupils equal, extraocular movements intact  Respiratory: Patient's speak in full sentences and does not appear short of breath  Cardiovascular: No lower extremity edema, non tender, no erythema  Left shoulder tender to palpation over the acromioclavicular joint.  Positive crossover noted.  Rotator cuff is not intact but does have more pain with certain movement.  Procedure: Real-time Ultrasound Guided Injection of left acromioclavicular joint Device: GE Logiq Q7 Ultrasound guided injection is preferred based studies that show increased duration, increased effect, greater accuracy, decreased procedural pain, increased response rate, and decreased cost with ultrasound guided versus blind injection.  Verbal informed consent obtained.  Time-out conducted.  Noted no overlying erythema, induration, or other signs of local infection.  Skin prepped in a sterile fashion.  Local anesthesia: Topical Ethyl chloride.  With sterile technique and under real time ultrasound guidance: With a 25-gauge half inch needle injected with 0.5 cc of 0.5% Marcaine and 0.5 cc of Kenalog  40 mg/mL Completed without difficulty  Pain immediately resolved suggesting accurate placement of the medication.  Advised to call if fevers/chills, erythema, induration, drainage, or persistent bleeding.  Impression: Technically successful ultrasound guided injection.    Impression and Recommendations:     The above documentation has been reviewed and is  accurate and complete Jeremy Duerksen M Felix Pratt, DO       [1] No Known Allergies  "

## 2024-10-03 ENCOUNTER — Ambulatory Visit: Admitting: Family Medicine

## 2024-10-03 ENCOUNTER — Other Ambulatory Visit: Payer: Self-pay

## 2024-10-03 ENCOUNTER — Encounter: Payer: Self-pay | Admitting: Family Medicine

## 2024-10-03 VITALS — BP 108/66 | HR 68 | Ht 70.0 in | Wt 226.0 lb

## 2024-10-03 DIAGNOSIS — M25512 Pain in left shoulder: Secondary | ICD-10-CM | POA: Diagnosis not present

## 2024-10-03 DIAGNOSIS — G8929 Other chronic pain: Secondary | ICD-10-CM

## 2024-10-03 NOTE — Assessment & Plan Note (Signed)
 Repeat injection given today, tolerated the procedure well, discussed icing regimen and home exercises, increase activity slowly.  Discussed icing regimen.  Follow-up again in 6 to 12 weeks.  Patient wants to avoid any surgical intervention.  Does have moderate to severe arthritic changes noted.

## 2024-10-03 NOTE — Patient Instructions (Signed)
 Good to see you! See you again in 3 months Injected AC joint

## 2024-12-26 ENCOUNTER — Ambulatory Visit: Admitting: Family Medicine
